# Patient Record
Sex: Female | Born: 1985 | Race: White | Hispanic: No | Marital: Single | Smoking: Current some day smoker
Health system: Southern US, Community
[De-identification: ages and names within clinical notes are randomized; demographics above are authoritative.]

## PROBLEM LIST (undated history)

## (undated) DIAGNOSIS — F419 Anxiety disorder, unspecified: Secondary | ICD-10-CM

## (undated) DIAGNOSIS — C801 Malignant (primary) neoplasm, unspecified: Secondary | ICD-10-CM

## (undated) DIAGNOSIS — G709 Myoneural disorder, unspecified: Secondary | ICD-10-CM

## (undated) HISTORY — DX: Malignant (primary) neoplasm, unspecified: C80.1

---

## 2013-06-16 DIAGNOSIS — C801 Malignant (primary) neoplasm, unspecified: Secondary | ICD-10-CM

## 2013-06-16 HISTORY — DX: Malignant (primary) neoplasm, unspecified: C80.1

## 2013-06-16 HISTORY — PX: OTHER SURGICAL HISTORY: SHX169

## 2016-07-05 DIAGNOSIS — R52 Pain, unspecified: Secondary | ICD-10-CM | POA: Diagnosis not present

## 2016-07-05 DIAGNOSIS — S79911A Unspecified injury of right hip, initial encounter: Secondary | ICD-10-CM | POA: Diagnosis not present

## 2016-07-05 DIAGNOSIS — M25551 Pain in right hip: Secondary | ICD-10-CM | POA: Diagnosis not present

## 2016-07-05 DIAGNOSIS — F17210 Nicotine dependence, cigarettes, uncomplicated: Secondary | ICD-10-CM | POA: Diagnosis not present

## 2016-07-05 DIAGNOSIS — S93401A Sprain of unspecified ligament of right ankle, initial encounter: Secondary | ICD-10-CM | POA: Diagnosis not present

## 2016-07-05 DIAGNOSIS — S86911A Strain of unspecified muscle(s) and tendon(s) at lower leg level, right leg, initial encounter: Secondary | ICD-10-CM | POA: Diagnosis not present

## 2016-07-05 DIAGNOSIS — S8991XA Unspecified injury of right lower leg, initial encounter: Secondary | ICD-10-CM | POA: Diagnosis not present

## 2016-07-05 DIAGNOSIS — M25571 Pain in right ankle and joints of right foot: Secondary | ICD-10-CM | POA: Diagnosis not present

## 2016-07-05 DIAGNOSIS — E079 Disorder of thyroid, unspecified: Secondary | ICD-10-CM | POA: Diagnosis not present

## 2016-07-05 DIAGNOSIS — S76011A Strain of muscle, fascia and tendon of right hip, initial encounter: Secondary | ICD-10-CM | POA: Diagnosis not present

## 2016-07-05 DIAGNOSIS — S96911A Strain of unspecified muscle and tendon at ankle and foot level, right foot, initial encounter: Secondary | ICD-10-CM | POA: Diagnosis not present

## 2016-07-05 DIAGNOSIS — M25561 Pain in right knee: Secondary | ICD-10-CM | POA: Diagnosis not present

## 2016-07-05 DIAGNOSIS — S99911A Unspecified injury of right ankle, initial encounter: Secondary | ICD-10-CM | POA: Diagnosis not present

## 2016-07-28 DIAGNOSIS — F603 Borderline personality disorder: Secondary | ICD-10-CM | POA: Diagnosis not present

## 2016-07-31 DIAGNOSIS — F603 Borderline personality disorder: Secondary | ICD-10-CM | POA: Diagnosis not present

## 2016-08-04 DIAGNOSIS — F900 Attention-deficit hyperactivity disorder, predominantly inattentive type: Secondary | ICD-10-CM | POA: Diagnosis not present

## 2016-08-19 DIAGNOSIS — F603 Borderline personality disorder: Secondary | ICD-10-CM | POA: Diagnosis not present

## 2016-10-10 DIAGNOSIS — M542 Cervicalgia: Secondary | ICD-10-CM | POA: Diagnosis not present

## 2016-10-10 DIAGNOSIS — M62830 Muscle spasm of back: Secondary | ICD-10-CM | POA: Diagnosis not present

## 2016-10-10 DIAGNOSIS — M545 Low back pain: Secondary | ICD-10-CM | POA: Diagnosis not present

## 2016-11-18 DIAGNOSIS — E039 Hypothyroidism, unspecified: Secondary | ICD-10-CM | POA: Diagnosis not present

## 2016-11-18 DIAGNOSIS — R5383 Other fatigue: Secondary | ICD-10-CM | POA: Diagnosis not present

## 2016-11-18 DIAGNOSIS — Z139 Encounter for screening, unspecified: Secondary | ICD-10-CM | POA: Diagnosis not present

## 2016-11-18 DIAGNOSIS — F900 Attention-deficit hyperactivity disorder, predominantly inattentive type: Secondary | ICD-10-CM | POA: Diagnosis not present

## 2016-11-25 DIAGNOSIS — F900 Attention-deficit hyperactivity disorder, predominantly inattentive type: Secondary | ICD-10-CM | POA: Diagnosis not present

## 2016-11-25 DIAGNOSIS — Z139 Encounter for screening, unspecified: Secondary | ICD-10-CM | POA: Diagnosis not present

## 2016-11-25 DIAGNOSIS — R5383 Other fatigue: Secondary | ICD-10-CM | POA: Diagnosis not present

## 2017-02-18 DIAGNOSIS — F900 Attention-deficit hyperactivity disorder, predominantly inattentive type: Secondary | ICD-10-CM | POA: Diagnosis not present

## 2017-05-20 DIAGNOSIS — F900 Attention-deficit hyperactivity disorder, predominantly inattentive type: Secondary | ICD-10-CM | POA: Diagnosis not present

## 2017-07-14 ENCOUNTER — Encounter: Payer: Self-pay | Admitting: Physician Assistant

## 2017-07-14 ENCOUNTER — Ambulatory Visit (INDEPENDENT_AMBULATORY_CARE_PROVIDER_SITE_OTHER): Payer: Federal, State, Local not specified - PPO | Admitting: Physician Assistant

## 2017-07-14 VITALS — BP 125/88 | HR 93 | Ht 62.0 in | Wt 140.0 lb

## 2017-07-14 DIAGNOSIS — Z13220 Encounter for screening for lipoid disorders: Secondary | ICD-10-CM

## 2017-07-14 DIAGNOSIS — R5383 Other fatigue: Secondary | ICD-10-CM | POA: Diagnosis not present

## 2017-07-14 DIAGNOSIS — C73 Malignant neoplasm of thyroid gland: Secondary | ICD-10-CM | POA: Diagnosis not present

## 2017-07-14 DIAGNOSIS — F17200 Nicotine dependence, unspecified, uncomplicated: Secondary | ICD-10-CM | POA: Diagnosis not present

## 2017-07-14 DIAGNOSIS — K5900 Constipation, unspecified: Secondary | ICD-10-CM | POA: Insufficient documentation

## 2017-07-14 DIAGNOSIS — L0591 Pilonidal cyst without abscess: Secondary | ICD-10-CM | POA: Insufficient documentation

## 2017-07-14 DIAGNOSIS — F902 Attention-deficit hyperactivity disorder, combined type: Secondary | ICD-10-CM | POA: Insufficient documentation

## 2017-07-14 DIAGNOSIS — Z131 Encounter for screening for diabetes mellitus: Secondary | ICD-10-CM

## 2017-07-14 MED ORDER — DOXYCYCLINE HYCLATE 100 MG PO TABS: 100.0000 mg | ORAL_TABLET | Freq: Two times a day (BID) | ORAL | 0 refills | Status: DC

## 2017-07-14 MED ORDER — IBUPROFEN 800 MG PO TABS: 800.0000 mg | ORAL_TABLET | Freq: Three times a day (TID) | ORAL | 2 refills | Status: DC | PRN

## 2017-07-14 MED ORDER — HYDROCODONE-ACETAMINOPHEN 5-325 MG PO TABS: 1.0000 | ORAL_TABLET | ORAL | 0 refills | Status: DC | PRN

## 2017-07-14 MED ORDER — AMPHETAMINE-DEXTROAMPHETAMINE 15 MG PO TABS: 15.0000 mg | ORAL_TABLET | Freq: Three times a day (TID) | ORAL | 0 refills | Status: DC

## 2017-07-14 NOTE — Progress Notes (Addendum)
Subjective:    Patient ID: Patricia Potter, female    DOB: [birth year], 32 y.o.   MRN: [id number]  HPI  Pt is a 32 year old female presenting to the clinic to establish care, manage her ADHD, and evaluate a bump on her coccyx.    She moved to the area from [facility] with her son and is currently going through a separation which is creating extra stress. She exercises on a regular basis and is a tobacco user. She reports that has not sleep well recently but attributes that to current stress.She has tried melatonin with no benefit. She has a history of ADHD and takes adderall IR 15mg  up to 3 times daily. She is content with the current dose and feels well controlled. She has felt fatigue, cold intolerance, and constipation that she is concerned may be related to her thyroid. 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 She denies feeling any lumps on her thyroid or trouble swallowing. She is also have pain on he tailbone that started recently.   She is up to date on preventative medicine except for her tetanus/TDAP. She has had a flu vaccine in late 2018, PAP Smear in 2018 at [facility] that reported no abnormalities.   Coccyx Pain:   She noticed a tender bump on her tailbone Saturday which is worse with sitting and laying on her left side. She feels like she bruised on the top of her lower back as well. She denies any recent injury. She has not tried NSAIDs or warm compresses. She has taken a hot bath to try to alleviate her symptoms but it has not changed. She rates the pain as 5/10.   .. Active Ambulatory Problems    Diagnosis Date Noted  . Papillary thyroid carcinoma ([place]) 07/14/2017  . Pilonidal cyst 07/14/2017  . Attention deficit hyperactivity disorder (ADHD), combined type 07/14/2017  . No energy 07/14/2017  . Constipation 07/14/2017  . Current smoker 07/14/2017   Resolved Ambulatory Problems    Diagnosis Date  Noted  . No Resolved Ambulatory Problems   No Additional Past Medical History    Review of Systems  Constitutional: Positive for fatigue. Negative for chills, diaphoresis, fever and unexpected weight change.  Respiratory: Negative for shortness of breath.   Cardiovascular: Negative for chest pain and palpitations.  Gastrointestinal: Positive for constipation. Negative for diarrhea, nausea and vomiting.  Endocrine: Positive for cold intolerance.  Genitourinary: Negative for difficulty urinating.  Musculoskeletal: Positive for back pain.       Objective:   Physical Exam  Constitutional: She is oriented to person, place, and time. She appears well-developed and well-nourished. No distress.  HENT:  Head: Normocephalic and atraumatic.  Neck: No thyromegaly present.  Cardiovascular: Normal rate, regular rhythm and normal heart sounds. Exam reveals no gallop and no friction rub.  No murmur heard. Pulmonary/Chest: Effort normal and breath sounds normal.  Neurological: She is alert and oriented to person, place, and time.  Skin:     Palpable solid nodule consistent with a pilonidal cyst in the location noted in the image. No erythema or fluctuance.   Psychiatric: She has a normal mood and affect. Her behavior is normal.    Vitals:   07/14/17 0930  BP: 125/88  Pulse: 93    Depression screen PHQ 2/9 07/14/2017  Decreased Interest 0  Down, Depressed, Hopeless 1  PHQ - 2 Score 1  Assessment & Plan:   Patricia Potter was seen today for establish care.  Diagnoses and all orders for this visit:  Pilonidal cyst -     doxycycline (VIBRA-TABS) 100 MG tablet; Take 1 tablet (100 mg total) by mouth 2 (two) times daily. For 10 days. -     ibuprofen (ADVIL,MOTRIN) 800 MG tablet; Take 1 tablet (800 mg total) by mouth every 8 (eight) hours as needed. -     HYDROcodone-acetaminophen (NORCO/VICODIN) 5-325 MG tablet; Take 1 tablet by mouth every 4 (four) hours as needed for moderate  pain.  Papillary thyroid carcinoma (HCC) -     US THYROID -     TSH -     T4, free -     T3, free  Current smoker  No energy -     US THYROID -     TSH -     T4, free -     T3, free -     CBC -     C-reactive protein -     Ferritin -     Sedimentation rate -     Vitamin B12 -     Vit D  25 hydroxy (rtn osteoporosis monitoring)  Constipation, unspecified constipation type -     US THYROID -     TSH -     T4, free -     T3, free  Attention deficit hyperactivity disorder (ADHD), combined type -     amphetamine-dextroamphetamine (ADDERALL) 15 MG tablet; Take 1 tablet by mouth 3 (three) times daily.  Screening for diabetes mellitus -     COMPLETE METABOLIC PANEL WITH GFR  Screening for lipid disorders -     Lipid Panel w/reflex Direct LDL   Patricia Potter was given an educational handout on pilonidal cysts. She has been instructed to the the [place] for pain as needed for severe pain, ibuprofen for daily pain, doxycycline for 10 days, and frequent warm compresses at the site of the pilonidal cyst. She should keep the area dry when not applying warm compresses. If the cyst gets more painful, infected, red, or is causing persistent problems then she will be referred to general surgery for removal.   We will order a thyroid US for monitoring of her remaining thyroid for chance of residual malignancy and have ordered thyroid labs to evaluate for hypothyroidism.   I will take over her ADHD management and have refilled her adderall prescription. She will be called with the results of her baseline labs. If sleep continues to be a problem, she can try valerian root or Unisom as sleep aids over the counter.

## 2017-07-14 NOTE — Patient Instructions (Addendum)
Get labs.  Refilled adderall. Follow up in 3 months.    Pilonidal Cyst A pilonidal cyst is a fluid-filled sac. It forms beneath the skin near your tailbone, at the top of the crease of your buttocks. A pilonidal cyst that is not large or infected may not cause symptoms or problems. If the cyst becomes irritated or infected, it may fill with pus. This causes pain and swelling (pilonidal abscess). An infected cyst may need to be treated with medicine, drained, or removed. What are the causes? The cause of a pilonidal cyst is not known. One cause may be a hair that grows into your skin (ingrown hair). What increases the risk? Pilonidal cysts are more common in boys and men. Risk factors include:  Having lots of hair near the crease of the buttocks.  Being overweight.  Having a pilonidal dimple.  Wearing tight clothing.  Not bathing or showering frequently.  Sitting for long periods of time.  What are the signs or symptoms? Signs and symptoms of a pilonidal cyst may include:  Redness.  Pain and tenderness.  Warmth.  Swelling.  Pus.  Fever.  How is this diagnosed? Your health care provider may diagnose a pilonidal cyst based on your symptoms and a physical exam. The health care provider may do a blood test to check for infection. If your cyst is draining pus, your health care provider may take a sample of the drainage to be tested at a laboratory. How is this treated? Surgery is the usual treatment for an infected pilonidal cyst. You may also have to take medicines before surgery. The type of surgery you have depends on the size and severity of the infected cyst. The different kinds of surgery include:  Incision and drainage. This is a procedure to open and drain the cyst.  Marsupialization. In this procedure, a large cyst or abscess may be opened and kept open by stitching the edges of the skin to the cyst walls.  Cyst removal. This procedure involves opening the skin and  removing all or part of the cyst.  Follow these instructions at home:  Follow all of your surgeon's instructions carefully if you had surgery.  Take medicines only as directed by your health care provider.  If you were prescribed an antibiotic medicine, finish it all even if you start to feel better.  Keep the area around your pilonidal cyst clean and dry.  Clean the area as directed by your health care provider. Pat the area dry with a clean towel. Do not rub it as this may cause bleeding.  Remove hair from the area around the cyst as directed by your health care provider.  Do not wear tight clothing or sit in one place for long periods of time.  There are many different ways to close and cover an incision, including stitches, skin glue, and adhesive strips. Follow your health care provider's instructions on: ? Incision care. ? Bandage (dressing) changes and removal. ? Incision closure removal. Contact a health care provider if:  You have drainage, redness, swelling, or pain at the site of the cyst.  You have a fever. This information is not intended to replace advice given to you by your health care provider. Make sure you discuss any questions you have with your health care provider. Document Released: 05/30/2000 Document Revised: 11/08/2015 Document Reviewed: 10/20/2013 Elsevier Interactive Patient Education  2018 [employer].

## 2017-07-15 ENCOUNTER — Encounter: Payer: Self-pay | Admitting: Physician Assistant

## 2017-07-15 DIAGNOSIS — R790 Abnormal level of blood mineral: Secondary | ICD-10-CM | POA: Insufficient documentation

## 2017-07-15 DIAGNOSIS — E559 Vitamin D deficiency, unspecified: Secondary | ICD-10-CM | POA: Insufficient documentation

## 2017-07-15 DIAGNOSIS — E538 Deficiency of other specified B group vitamins: Secondary | ICD-10-CM | POA: Insufficient documentation

## 2017-07-15 LAB — COMPLETE METABOLIC PANEL WITH GFR
AG RATIO: 1.7 (calc) (ref 1.0–2.5)
ALBUMIN MSPROF: 4.6 g/dL (ref 3.6–5.1)
ALT: 7 U/L (ref 6–29)
AST: 14 U/L (ref 10–30)
Alkaline phosphatase (APISO): 54 U/L (ref 33–115)
BILIRUBIN TOTAL: 0.3 mg/dL (ref 0.2–1.2)
BUN: 17 mg/dL (ref 7–25)
CHLORIDE: 108 mmol/L (ref 98–110)
CO2: 24 mmol/L (ref 20–32)
Calcium: 9.6 mg/dL (ref 8.6–10.2)
Creat: 0.69 mg/dL (ref 0.50–1.10)
GFR, EST AFRICAN AMERICAN: 135 mL/min/{1.73_m2} (ref >=60)
GFR, Est Non African American: 117 mL/min/{1.73_m2} (ref >=60)
GLOBULIN: 2.7 g/dL (ref 1.9–3.7)
GLUCOSE: 95 mg/dL (ref 65–99)
POTASSIUM: 4.6 mmol/L (ref 3.5–5.3)
SODIUM: 140 mmol/L (ref 135–146)
TOTAL PROTEIN: 7.3 g/dL (ref 6.1–8.1)

## 2017-07-15 LAB — SEDIMENTATION RATE: Sed Rate: 17 mm/h (ref 0–20)

## 2017-07-15 LAB — LIPID PANEL W/REFLEX DIRECT LDL
CHOL/HDL RATIO: 4 (calc) (ref <5.0)
CHOLESTEROL: 182 mg/dL (ref <200)
HDL: 46 mg/dL — AB (ref >50)
LDL CHOLESTEROL (CALC): 115 mg/dL — AB
Non-HDL Cholesterol (Calc): 136 mg/dL (calc) — ABNORMAL HIGH (ref <130)
Triglycerides: 104 mg/dL (ref <150)

## 2017-07-15 LAB — C-REACTIVE PROTEIN: CRP: 2.3 mg/L (ref <8.0)

## 2017-07-15 LAB — CBC
HCT: 40.6 % (ref 35.0–45.0)
Hemoglobin: 13.7 g/dL (ref 11.7–15.5)
MCH: 28.2 pg (ref 27.0–33.0)
MCHC: 33.7 g/dL (ref 32.0–36.0)
MCV: 83.5 fL (ref 80.0–100.0)
MPV: 11.4 fL (ref 7.5–12.5)
Platelets: 297 10*3/uL (ref 140–400)
RBC: 4.86 10*6/uL (ref 3.80–5.10)
RDW: 14.2 % (ref 11.0–15.0)
WBC: 10.1 10*3/uL (ref 3.8–10.8)

## 2017-07-15 LAB — FERRITIN: FERRITIN: 4 ng/mL — AB (ref 10–154)

## 2017-07-15 LAB — T3, FREE: T3, Free: 3.1 pg/mL (ref 2.3–4.2)

## 2017-07-15 LAB — T4, FREE: Free T4: 1.4 ng/dL (ref 0.8–1.8)

## 2017-07-15 LAB — TSH: TSH: 1.57 m[IU]/L

## 2017-07-15 LAB — VITAMIN B12: VITAMIN B 12: 237 pg/mL (ref 200–1100)

## 2017-07-15 LAB — VITAMIN D 25 HYDROXY (VIT D DEFICIENCY, FRACTURES): VIT D 25 HYDROXY: 23 ng/mL — AB (ref 30–100)

## 2017-07-16 ENCOUNTER — Ambulatory Visit (INDEPENDENT_AMBULATORY_CARE_PROVIDER_SITE_OTHER): Payer: Federal, State, Local not specified - PPO

## 2017-07-16 DIAGNOSIS — C73 Malignant neoplasm of thyroid gland: Secondary | ICD-10-CM | POA: Diagnosis not present

## 2017-07-16 DIAGNOSIS — Z85850 Personal history of malignant neoplasm of thyroid: Secondary | ICD-10-CM

## 2017-07-16 DIAGNOSIS — E041 Nontoxic single thyroid nodule: Secondary | ICD-10-CM | POA: Diagnosis not present

## 2017-07-30 ENCOUNTER — Encounter: Payer: Self-pay | Admitting: Physician Assistant

## 2017-07-30 ENCOUNTER — Other Ambulatory Visit: Payer: Self-pay | Admitting: Physician Assistant

## 2017-07-30 DIAGNOSIS — F902 Attention-deficit hyperactivity disorder, combined type: Secondary | ICD-10-CM

## 2017-09-08 ENCOUNTER — Encounter: Payer: Self-pay | Admitting: Physician Assistant

## 2017-09-08 ENCOUNTER — Ambulatory Visit (INDEPENDENT_AMBULATORY_CARE_PROVIDER_SITE_OTHER): Payer: Federal, State, Local not specified - PPO | Admitting: Physician Assistant

## 2017-09-08 VITALS — BP 111/74 | HR 86 | Temp 98.2°F | Ht 62.0 in | Wt 139.0 lb

## 2017-09-08 DIAGNOSIS — F419 Anxiety disorder, unspecified: Secondary | ICD-10-CM

## 2017-09-08 DIAGNOSIS — R05 Cough: Secondary | ICD-10-CM | POA: Diagnosis not present

## 2017-09-08 DIAGNOSIS — R058 Other specified cough: Secondary | ICD-10-CM

## 2017-09-08 DIAGNOSIS — F339 Major depressive disorder, recurrent, unspecified: Secondary | ICD-10-CM | POA: Diagnosis not present

## 2017-09-08 DIAGNOSIS — F430 Acute stress reaction: Secondary | ICD-10-CM | POA: Diagnosis not present

## 2017-09-08 DIAGNOSIS — M549 Dorsalgia, unspecified: Secondary | ICD-10-CM

## 2017-09-08 MED ORDER — CYCLOBENZAPRINE HCL 10 MG PO TABS: 10.0000 mg | ORAL_TABLET | Freq: Three times a day (TID) | ORAL | 0 refills | Status: DC | PRN

## 2017-09-08 MED ORDER — HYDROXYZINE HCL 10 MG PO TABS: 10.0000 mg | ORAL_TABLET | Freq: Three times a day (TID) | ORAL | 0 refills | Status: DC | PRN

## 2017-09-08 MED ORDER — BENZONATATE 200 MG PO CAPS: 200.0000 mg | ORAL_CAPSULE | Freq: Two times a day (BID) | ORAL | 0 refills | Status: DC | PRN

## 2017-09-08 MED ORDER — ESCITALOPRAM OXALATE 10 MG PO TABS: 10.0000 mg | ORAL_TABLET | Freq: Every day | ORAL | 1 refills | Status: DC

## 2017-09-08 NOTE — Progress Notes (Deleted)
   Subjective:    Patient ID: Patricia Potter, female    DOB: 03-20-1986, 32 y.o.   MRN: [id number]  HPI    Review of Systems  All other systems reviewed and are negative.      Objective:   Physical Exam  Constitutional: She appears well-developed and well-nourished.  HENT:  Head: Normocephalic and atraumatic.  Neck: Normal range of motion. Neck supple.  Cardiovascular: Normal rate, regular rhythm and normal heart sounds.  Pulmonary/Chest: Effort normal and breath sounds normal. She has no wheezes.  Lymphadenopathy:    She has no cervical adenopathy.          Assessment & Plan:  Marland KitchenMarland KitchenDiagnoses and all orders for this visit:  Post-viral cough syndrome -     benzonatate (TESSALON) 200 MG capsule; Take 1 capsule (200 mg total) by mouth 2 (two) times daily as needed for cough.  Depression, recurrent ([place]) -     escitalopram (LEXAPRO) 10 MG tablet; Take 1 tablet (10 mg total) by mouth daily.  Anxiety -     escitalopram (LEXAPRO) 10 MG tablet; Take 1 tablet (10 mg total) by mouth daily. -     hydrOXYzine (ATARAX/VISTARIL) 10 MG tablet; Take 1 tablet (10 mg total) by mouth 3 (three) times daily as needed.  Stress reaction -     escitalopram (LEXAPRO) 10 MG tablet; Take 1 tablet (10 mg total) by mouth daily.  Mid-back pain, acute -     cyclobenzaprine (FLEXERIL) 10 MG tablet; Take 1 tablet (10 mg total) by mouth 3 (three) times daily as needed for muscle spasms.

## 2017-09-08 NOTE — Patient Instructions (Signed)

## 2017-09-08 NOTE — Progress Notes (Signed)
Subjective:     Patient ID: Patricia Potter, female   DOB: [birth year], 32 y.o.   MRN: [id number]  HPI Patricia Potter is a 32 year old female who presented with a complaint regarding a cough and some congestion for the past week or so. She states the cough feels deep and the sputum is clear. She denies any fevers, chills, nausea, vomiting, body aches, sore throat. She has attempted OTC pseudofed for her symptoms.   She also complains about some depression. She is going through a divorce with her husband and states that her husband is wanting very little to do with her and the children. She is having a tough time dealing with this and helping her kids through the situation. She states she was on something a long time ago for depression in high school but she does not remember what. She denies any SI/HI.  She also mentions some upper middle back pain. She is an avid weight lifter and runner. She has not taken anyting for this pain.  .. Active Ambulatory Problems    Diagnosis Date Noted  . Papillary thyroid carcinoma ([place]) 07/14/2017  . Pilonidal cyst 07/14/2017  . Attention deficit hyperactivity disorder (ADHD), combined type 07/14/2017  . No energy 07/14/2017  . Constipation 07/14/2017  . Current smoker 07/14/2017  . Low serum vitamin B12 07/15/2017  . Vitamin D insufficiency 07/15/2017  . Low iron stores 07/15/2017   Resolved Ambulatory Problems    Diagnosis Date Noted  . No Resolved Ambulatory Problems   Past Medical History:  Diagnosis Date  . Papillary carcinoma ([place]) 2015      Review of Systems  Constitutional: Negative for appetite change, chills and fever.  HENT: Positive for congestion. Negative for ear pain, sinus pressure, sinus pain and sore throat.   Respiratory: Positive for cough. Negative for shortness of breath.   Cardiovascular: Negative for chest pain and palpitations.  Gastrointestinal: Negative for nausea and vomiting.  Musculoskeletal: Positive for back pain.   Psychiatric/Behavioral: Positive for behavioral problems ( depression). Negative for suicidal ideas.       Objective:   Physical Exam  Constitutional: She is oriented to person, place, and time. She appears well-developed and well-nourished. No distress.  HENT:  Head: Normocephalic and atraumatic.  Right Ear: Tympanic membrane and external ear normal.  Left Ear: Tympanic membrane and external ear normal.  Nose: Nose normal.  Mouth/Throat: Oropharynx is clear and moist and mucous membranes are normal. No oropharyngeal exudate, posterior oropharyngeal edema or posterior oropharyngeal erythema.  TM's clear bilaterally.  No sinus tenderness to palpation.   Eyes: Conjunctivae are normal. Right eye exhibits no discharge. Left eye exhibits no discharge.  Cardiovascular: Normal rate, regular rhythm and normal heart sounds.  Pulmonary/Chest: Breath sounds normal. She is in respiratory distress.  Musculoskeletal: Normal range of motion.  Diffuse thoracic paravertebral muscle spasms  Lymphadenopathy:    She has no cervical adenopathy.  Neurological: She is alert and oriented to person, place, and time.  Skin: Skin is warm and dry.  Psychiatric: She has a normal mood and affect. Her behavior is normal. Thought content normal.       Assessment:     Marland KitchenMarland KitchenDiagnoses and all orders for this visit:  Post-viral cough syndrome -     benzonatate (TESSALON) 200 MG capsule; Take 1 capsule (200 mg total) by mouth 2 (two) times daily as needed for cough.  Depression, recurrent ([place]) -     escitalopram (LEXAPRO) 10 MG tablet; Take 1 tablet (10 mg  total) by mouth daily.  Anxiety -     escitalopram (LEXAPRO) 10 MG tablet; Take 1 tablet (10 mg total) by mouth daily. -     hydrOXYzine (ATARAX/VISTARIL) 10 MG tablet; Take 1 tablet (10 mg total) by mouth 3 (three) times daily as needed.  Stress reaction -     escitalopram (LEXAPRO) 10 MG tablet; Take 1 tablet (10 mg total) by mouth daily.  Mid-back pain,  acute -     cyclobenzaprine (FLEXERIL) 10 MG tablet; Take 1 tablet (10 mg total) by mouth 3 (three) times daily as needed for muscle spasms.   .. Depression screen [facility] 2/9 09/08/2017 07/14/2017  Decreased Interest 2 0  Down, Depressed, Hopeless 1 1  PHQ - 2 Score 3 1  Altered sleeping 2 -  Tired, decreased energy 1 -  Change in appetite 1 -  Feeling bad or failure about yourself  1 -  Trouble concentrating 0 -  Moving slowly or fidgety/restless 0 -  Suicidal thoughts 0 -  PHQ-9 Score 8 -  Difficult doing work/chores Very difficult -   .. GAD 7 : Generalized Anxiety Score 09/08/2017  Nervous, Anxious, on Edge 2  Control/stop worrying 2  Worry too much - different things 1  Trouble relaxing 1  Restless 0  Easily annoyed or irritable 2  Afraid - awful might happen 1  Total GAD 7 Score 9  Anxiety Difficulty Very difficult        Plan:        discussed cough likely post viral. Given tessalon pearls as needed. Symptomatic treatment discussed. Follow up as needed.   Will start lexapro for mood. Discussed side effects. Follow up in 4-6 weeks.  Vistaril as needed for acute anxiety.  Encouraged family counseling to help as going through divorce.   Discussed muscle spasm of mid back. HO given for upper back exercises. Consider heat, massage, biofreeze, NSAIDs. Follow up as needed.

## 2017-09-17 ENCOUNTER — Encounter: Payer: Self-pay | Admitting: Physician Assistant

## 2017-09-17 ENCOUNTER — Other Ambulatory Visit: Payer: Self-pay | Admitting: Physician Assistant

## 2017-09-17 DIAGNOSIS — F902 Attention-deficit hyperactivity disorder, combined type: Secondary | ICD-10-CM

## 2017-09-17 MED ORDER — AMPHETAMINE-DEXTROAMPHETAMINE 15 MG PO TABS: 15.0000 mg | ORAL_TABLET | Freq: Three times a day (TID) | ORAL | 0 refills | Status: DC

## 2017-10-12 ENCOUNTER — Ambulatory Visit: Payer: Federal, State, Local not specified - PPO | Admitting: Physician Assistant

## 2017-10-12 DIAGNOSIS — Z0189 Encounter for other specified special examinations: Secondary | ICD-10-CM

## 2017-11-03 ENCOUNTER — Other Ambulatory Visit: Payer: Self-pay | Admitting: Physician Assistant

## 2017-11-03 ENCOUNTER — Encounter: Payer: Self-pay | Admitting: Physician Assistant

## 2017-11-03 DIAGNOSIS — F430 Acute stress reaction: Secondary | ICD-10-CM

## 2017-11-03 DIAGNOSIS — F419 Anxiety disorder, unspecified: Secondary | ICD-10-CM

## 2017-11-03 DIAGNOSIS — F902 Attention-deficit hyperactivity disorder, combined type: Secondary | ICD-10-CM

## 2017-11-03 DIAGNOSIS — F339 Major depressive disorder, recurrent, unspecified: Secondary | ICD-10-CM

## 2017-11-03 MED ORDER — ESCITALOPRAM OXALATE 10 MG PO TABS: 10.0000 mg | ORAL_TABLET | Freq: Every day | ORAL | 0 refills | Status: DC

## 2017-11-03 MED ORDER — AMPHETAMINE-DEXTROAMPHETAMINE 15 MG PO TABS: 15.0000 mg | ORAL_TABLET | Freq: Three times a day (TID) | ORAL | 0 refills | Status: DC

## 2017-11-03 MED ORDER — HYDROXYZINE HCL 10 MG PO TABS: 10.0000 mg | ORAL_TABLET | Freq: Three times a day (TID) | ORAL | 0 refills | Status: DC | PRN

## 2017-12-15 ENCOUNTER — Other Ambulatory Visit: Payer: Self-pay | Admitting: Physician Assistant

## 2017-12-15 DIAGNOSIS — F902 Attention-deficit hyperactivity disorder, combined type: Secondary | ICD-10-CM

## 2017-12-16 ENCOUNTER — Encounter: Payer: Self-pay | Admitting: Physician Assistant

## 2017-12-16 MED ORDER — AMPHETAMINE-DEXTROAMPHETAMINE 15 MG PO TABS: 15.0000 mg | ORAL_TABLET | Freq: Three times a day (TID) | ORAL | 0 refills | Status: DC

## 2017-12-16 NOTE — Telephone Encounter (Signed)
My electronic controlled substance device is not working. Pt will have to pick up.

## 2017-12-16 NOTE — Telephone Encounter (Signed)
Printed and reday for pick up.

## 2017-12-30 ENCOUNTER — Other Ambulatory Visit: Payer: Self-pay | Admitting: Physician Assistant

## 2017-12-30 DIAGNOSIS — F419 Anxiety disorder, unspecified: Secondary | ICD-10-CM

## 2017-12-30 DIAGNOSIS — F430 Acute stress reaction: Secondary | ICD-10-CM

## 2017-12-30 DIAGNOSIS — F339 Major depressive disorder, recurrent, unspecified: Secondary | ICD-10-CM

## 2017-12-31 ENCOUNTER — Other Ambulatory Visit: Payer: Self-pay

## 2017-12-31 DIAGNOSIS — F339 Major depressive disorder, recurrent, unspecified: Secondary | ICD-10-CM

## 2017-12-31 DIAGNOSIS — F419 Anxiety disorder, unspecified: Secondary | ICD-10-CM

## 2017-12-31 DIAGNOSIS — F430 Acute stress reaction: Secondary | ICD-10-CM

## 2017-12-31 MED ORDER — ESCITALOPRAM OXALATE 10 MG PO TABS
10.0000 mg | ORAL_TABLET | Freq: Every day | ORAL | 0 refills | Status: DC
Start: 2017-12-31 — End: 2018-04-15

## 2018-01-11 ENCOUNTER — Ambulatory Visit: Payer: Federal, State, Local not specified - PPO | Admitting: Physician Assistant

## 2018-02-04 ENCOUNTER — Other Ambulatory Visit: Payer: Self-pay | Admitting: Physician Assistant

## 2018-02-04 DIAGNOSIS — F902 Attention-deficit hyperactivity disorder, combined type: Secondary | ICD-10-CM

## 2018-02-05 NOTE — Telephone Encounter (Signed)
Tried to call patient and the home number just gives a busy signal and the mobile number says its been changed or disconnected. KG LPN

## 2018-02-05 NOTE — Telephone Encounter (Signed)
She needs to schedule an appointment.  She was last seen in March and was actually supposed to follow-up in 4 to 6 weeks for a new start of Lexapro which we have been refilling.  Plus since she is on Adderall she really needs to be followed up every 4 months at a minimum.  Please have her schedule appointment I did not see that she Artie has one on the books.

## 2018-02-09 ENCOUNTER — Encounter: Payer: Self-pay | Admitting: Physician Assistant

## 2018-03-26 ENCOUNTER — Ambulatory Visit: Payer: Federal, State, Local not specified - PPO | Admitting: Physician Assistant

## 2018-03-31 ENCOUNTER — Telehealth: Payer: Self-pay | Admitting: Physician Assistant

## 2018-03-31 DIAGNOSIS — F902 Attention-deficit hyperactivity disorder, combined type: Secondary | ICD-10-CM

## 2018-03-31 NOTE — Telephone Encounter (Signed)
I called Katara to schedule, no answer. I left her a voicemail to call back

## 2018-03-31 NOTE — Telephone Encounter (Signed)
Patricia Potter called in apologizing for missing her appt on Friday. She wanted to come in today to get refills, however I explained that [name] was out of the office. She asked if someone could call in refills for her Adderakl and the Lexapro. Pharmacy on file is correct.

## 2018-03-31 NOTE — Telephone Encounter (Signed)
She needs to reschedule, then I will route to covering Provider to see if they will write short term to last until appointment.

## 2018-04-05 MED ORDER — AMPHETAMINE-DEXTROAMPHETAMINE 15 MG PO TABS: 15.0000 mg | ORAL_TABLET | Freq: Three times a day (TID) | ORAL | 0 refills | Status: DC

## 2018-04-05 NOTE — Telephone Encounter (Signed)
I did send one more month. Needs follow up.

## 2018-04-12 ENCOUNTER — Ambulatory Visit: Payer: Federal, State, Local not specified - PPO | Admitting: Physician Assistant

## 2018-04-15 ENCOUNTER — Ambulatory Visit (INDEPENDENT_AMBULATORY_CARE_PROVIDER_SITE_OTHER): Payer: Federal, State, Local not specified - PPO | Admitting: Physician Assistant

## 2018-04-15 ENCOUNTER — Ambulatory Visit (INDEPENDENT_AMBULATORY_CARE_PROVIDER_SITE_OTHER): Payer: Federal, State, Local not specified - PPO | Admitting: Physical Therapy

## 2018-04-15 ENCOUNTER — Encounter: Payer: Self-pay | Admitting: Physical Therapy

## 2018-04-15 ENCOUNTER — Encounter: Payer: Self-pay | Admitting: Physician Assistant

## 2018-04-15 DIAGNOSIS — M546 Pain in thoracic spine: Secondary | ICD-10-CM

## 2018-04-15 DIAGNOSIS — R0789 Other chest pain: Secondary | ICD-10-CM | POA: Diagnosis not present

## 2018-04-15 DIAGNOSIS — F339 Major depressive disorder, recurrent, unspecified: Secondary | ICD-10-CM

## 2018-04-15 DIAGNOSIS — M5136 Other intervertebral disc degeneration, lumbar region: Secondary | ICD-10-CM | POA: Insufficient documentation

## 2018-04-15 DIAGNOSIS — F430 Acute stress reaction: Secondary | ICD-10-CM

## 2018-04-15 DIAGNOSIS — F902 Attention-deficit hyperactivity disorder, combined type: Secondary | ICD-10-CM

## 2018-04-15 DIAGNOSIS — F419 Anxiety disorder, unspecified: Secondary | ICD-10-CM

## 2018-04-15 DIAGNOSIS — M51369 Other intervertebral disc degeneration, lumbar region without mention of lumbar back pain or lower extremity pain: Secondary | ICD-10-CM | POA: Insufficient documentation

## 2018-04-15 MED ORDER — NAPROXEN 500 MG PO TABS: 500.0000 mg | ORAL_TABLET | Freq: Two times a day (BID) | ORAL | 0 refills | Status: DC | PRN

## 2018-04-15 MED ORDER — ESCITALOPRAM OXALATE 10 MG PO TABS: 10.0000 mg | ORAL_TABLET | Freq: Every day | ORAL | 0 refills | Status: DC

## 2018-04-15 MED ORDER — NONFORMULARY OR COMPOUNDED ITEM: 0 refills | Status: DC

## 2018-04-15 MED ORDER — AMPHETAMINE-DEXTROAMPHETAMINE 15 MG PO TABS: 15.0000 mg | ORAL_TABLET | Freq: Three times a day (TID) | ORAL | 0 refills | Status: DC

## 2018-04-15 MED ORDER — METHOCARBAMOL 500 MG PO TABS: 500.0000 mg | ORAL_TABLET | Freq: Three times a day (TID) | ORAL | 0 refills | Status: DC | PRN
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
## 2018-04-15 NOTE — Patient Instructions (Signed)
Access Code: [facility]  URL: [email or web address]  Date: 04/15/2018  Prepared by: Faustino Congress   Exercises  Supine Chest Stretch on Foam Roll - 1 reps - 1 sets - 3-5 min hold - 1x daily - 7x weekly  Standing 'L' Stretch at Counter - 3 reps - 1 sets - 30 sec hold - 1x daily - 7x weekly  Patient Education  Trigger Point Dry Needling
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
## 2018-04-21 ENCOUNTER — Telehealth: Payer: Self-pay | Admitting: Physical Therapy

## 2018-04-21 ENCOUNTER — Encounter: Payer: Self-pay | Admitting: Physical Therapy

## 2018-04-21 NOTE — Telephone Encounter (Signed)
Attempted to call pt due to no show for PT appt.  Unable to leave message.

## 2018-04-23 ENCOUNTER — Encounter: Payer: Federal, State, Local not specified - PPO | Admitting: Physical Therapy

## 2018-06-07 ENCOUNTER — Other Ambulatory Visit: Payer: Self-pay | Admitting: Physician Assistant

## 2018-06-07 DIAGNOSIS — F902 Attention-deficit hyperactivity disorder, combined type: Secondary | ICD-10-CM

## 2018-06-08 ENCOUNTER — Encounter: Payer: Self-pay | Admitting: Physician Assistant

## 2018-06-08 ENCOUNTER — Other Ambulatory Visit: Payer: Self-pay | Admitting: Physician Assistant

## 2018-06-08 DIAGNOSIS — F902 Attention-deficit hyperactivity disorder, combined type: Secondary | ICD-10-CM

## 2018-06-10 MED ORDER — AMPHETAMINE-DEXTROAMPHETAMINE 15 MG PO TABS: 15.0000 mg | ORAL_TABLET | Freq: Three times a day (TID) | ORAL | 0 refills | Status: DC

## 2018-06-10 NOTE — Telephone Encounter (Signed)
Looks like [facility] already sent

## 2018-06-17 ENCOUNTER — Other Ambulatory Visit: Payer: Self-pay | Admitting: Physician Assistant

## 2018-06-17 DIAGNOSIS — Z1239 Encounter for other screening for malignant neoplasm of breast: Secondary | ICD-10-CM

## 2018-06-21 ENCOUNTER — Ambulatory Visit: Payer: Federal, State, Local not specified - PPO | Admitting: Physician Assistant

## 2018-06-23 ENCOUNTER — Ambulatory Visit: Payer: Federal, State, Local not specified - PPO | Admitting: Physician Assistant

## 2018-06-23 ENCOUNTER — Ambulatory Visit: Payer: Federal, State, Local not specified - PPO

## 2018-07-29 ENCOUNTER — Other Ambulatory Visit: Payer: Self-pay | Admitting: Physician Assistant

## 2018-07-29 DIAGNOSIS — F430 Acute stress reaction: Secondary | ICD-10-CM

## 2018-07-29 DIAGNOSIS — F902 Attention-deficit hyperactivity disorder, combined type: Secondary | ICD-10-CM

## 2018-07-29 DIAGNOSIS — F339 Major depressive disorder, recurrent, unspecified: Secondary | ICD-10-CM

## 2018-07-29 DIAGNOSIS — F419 Anxiety disorder, unspecified: Secondary | ICD-10-CM

## 2018-07-30 MED ORDER — ESCITALOPRAM OXALATE 10 MG PO TABS: 10.0000 mg | ORAL_TABLET | Freq: Every day | ORAL | 0 refills | Status: DC

## 2018-07-30 MED ORDER — AMPHETAMINE-DEXTROAMPHETAMINE 15 MG PO TABS: 15.0000 mg | ORAL_TABLET | Freq: Three times a day (TID) | ORAL | 0 refills | Status: DC

## 2018-08-30 ENCOUNTER — Ambulatory Visit (INDEPENDENT_AMBULATORY_CARE_PROVIDER_SITE_OTHER): Payer: Federal, State, Local not specified - PPO | Admitting: Physician Assistant

## 2018-08-30 ENCOUNTER — Other Ambulatory Visit: Payer: Self-pay

## 2018-08-30 ENCOUNTER — Ambulatory Visit (INDEPENDENT_AMBULATORY_CARE_PROVIDER_SITE_OTHER): Payer: Federal, State, Local not specified - PPO

## 2018-08-30 ENCOUNTER — Encounter: Payer: Self-pay | Admitting: Physician Assistant

## 2018-08-30 VITALS — BP 111/68 | HR 73 | Resp 18 | Ht 62.0 in | Wt 156.0 lb

## 2018-08-30 DIAGNOSIS — F902 Attention-deficit hyperactivity disorder, combined type: Secondary | ICD-10-CM | POA: Diagnosis not present

## 2018-08-30 DIAGNOSIS — F419 Anxiety disorder, unspecified: Secondary | ICD-10-CM | POA: Diagnosis not present

## 2018-08-30 DIAGNOSIS — M5412 Radiculopathy, cervical region: Secondary | ICD-10-CM

## 2018-08-30 DIAGNOSIS — F339 Major depressive disorder, recurrent, unspecified: Secondary | ICD-10-CM

## 2018-08-30 DIAGNOSIS — F430 Acute stress reaction: Secondary | ICD-10-CM | POA: Diagnosis not present

## 2018-08-30 DIAGNOSIS — M50321 Other cervical disc degeneration at C4-C5 level: Secondary | ICD-10-CM

## 2018-08-30 DIAGNOSIS — M542 Cervicalgia: Secondary | ICD-10-CM

## 2018-08-30 DIAGNOSIS — M546 Pain in thoracic spine: Secondary | ICD-10-CM

## 2018-08-30 MED ORDER — NAPROXEN 500 MG PO TABS: 500.0000 mg | ORAL_TABLET | Freq: Two times a day (BID) | ORAL | 2 refills | Status: DC | PRN

## 2018-08-30 MED ORDER — PREDNISONE 50 MG PO TABS: ORAL_TABLET | ORAL | 0 refills | Status: DC

## 2018-08-30 MED ORDER — AMPHETAMINE-DEXTROAMPHETAMINE 15 MG PO TABS: 15.0000 mg | ORAL_TABLET | Freq: Two times a day (BID) | ORAL | 0 refills | Status: DC

## 2018-08-30 MED ORDER — ESCITALOPRAM OXALATE 10 MG PO TABS: 10.0000 mg | ORAL_TABLET | Freq: Every day | ORAL | 4 refills | Status: DC

## 2018-08-30 NOTE — Progress Notes (Signed)
Subjective:    Patient ID: Patricia Potter, female    DOB: 13-Aug-1985, 32 y.o.   MRN: [id number]  HPI  Pt is a 33 yo female with ADD, depression, anxiety who presents to the clinic for medication refills.   Doing well on adderall. No problems or concerns. No increase in anxiety or depression.   Mood is overall good. No SI/HC. Medication working well.   She continues to have neck pain and numbness and tingling down arm into hands. Denies any strength changes. No new trauma or injury. Hx of this off and on. Previously controlled with chiropractic and massage therapy care. Not done anything since moved back home. Robaxin does not seem to help that much. PT is too costly. Tried tens unit but does not feel like it gives much benefit.   .. Active Ambulatory Problems    Diagnosis Date Noted  . Papillary thyroid carcinoma ([place]) 07/14/2017  . Pilonidal cyst 07/14/2017  . Attention deficit hyperactivity disorder (ADHD), combined type 07/14/2017  . No energy 07/14/2017  . Constipation 07/14/2017  . Current smoker 07/14/2017  . Low serum vitamin B12 07/15/2017  . Vitamin D insufficiency 07/15/2017  . Low iron stores 07/15/2017  . Depression, recurrent ([place]) 09/08/2017  . Anxiety 09/08/2017  . Stress reaction 09/08/2017  . Mid-back pain, acute 09/08/2017  . Lumbar degenerative disc disease 04/15/2018  . Cervical radiculopathy 09/01/2018  . Neck pain 09/01/2018   Resolved Ambulatory Problems    Diagnosis Date Noted  . No Resolved Ambulatory Problems   Past Medical History:  Diagnosis Date  . 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  Objective:   Physical Exam Vitals signs reviewed.  Constitutional:      Appearance: Normal appearance.  HENT:     Head: Normocephalic.  Cardiovascular:     Rate and Rhythm: Normal rate and regular rhythm.     Pulses: Normal pulses.  Pulmonary:     Effort: Pulmonary effort is normal.     Breath sounds: Normal breath  sounds.  Musculoskeletal:     Comments: NROm of neck.  No cspine tenderness to palpation.  5/5 strength upper bilateral ext.    Neurological:     General: No focal deficit present.     Mental Status: She is alert.           Assessment & Plan:  Marland KitchenMarland KitchenThai was seen today for follow-up.  Diagnoses and all orders for this visit:  Attention deficit hyperactivity disorder (ADHD), combined type -     amphetamine-dextroamphetamine (ADDERALL) 15 MG tablet; Take 1 tablet by mouth 2 (two) times daily. -     amphetamine-dextroamphetamine (ADDERALL) 15 MG tablet; Take 1 tablet by mouth 2 (two) times daily. -     amphetamine-dextroamphetamine (ADDERALL) 15 MG tablet; Take 1 tablet by mouth 2 (two) times daily.  Depression, recurrent ([place]) -     escitalopram (LEXAPRO) 10 MG tablet; Take 1 tablet (10 mg total) by mouth daily.  Anxiety -     escitalopram (LEXAPRO) 10 MG tablet; Take 1 tablet (10 mg total) by mouth daily.  Stress reaction -     escitalopram (LEXAPRO) 10 MG tablet; Take 1 tablet (10 mg total) by mouth daily.  Acute bilateral thoracic back pain -     naproxen (NAPROSYN) 500 MG tablet; Take 1 tablet (500 mg total) by mouth 2 (two) times daily as needed for moderate pain.  Neck pain -  DG Cervical Spine Complete -     predniSONE (DELTASONE) 50 MG tablet; One tab PO daily for 5 days.  Cervical radiculopathy -     DG Cervical Spine Complete -     predniSONE (DELTASONE) 50 MG tablet; One tab PO daily for 5 days.  refilled adderall for 3 months.   .. Depression screen [facility] 2/9 09/01/2018 04/15/2018 09/08/2017 07/14/2017  Decreased Interest 1 1 2  0  Down, Depressed, Hopeless 1 1 1 1   PHQ - 2 Score 2 2 3 1   Altered sleeping 0 0 2 -  Tired, decreased energy - 1 1 -  Change in appetite 0 2 1 -  Feeling bad or failure about yourself  0 1 1 -  Trouble concentrating 0 0 0 -  Moving slowly or fidgety/restless - 0 0 -  Suicidal thoughts 0 0 0 -  PHQ-9 Score 2 6 8  -  Difficult  doing work/chores Not difficult at all Somewhat difficult Very difficult -   .. GAD 7 : Generalized Anxiety Score 09/01/2018 04/15/2018 09/08/2017  Nervous, Anxious, on Edge 1 1 2   Control/stop worrying 1 1 2   Worry too much - different things 1 1 1   Trouble relaxing - 0 1  Restless 0 0 0  Easily annoyed or irritable 1 1 2   Afraid - awful might happen 0 1 1  Total GAD 7 Score - 5 9  Anxiety Difficulty Not difficult at all Somewhat difficult Very difficult    Refilled lexapro.   Neck pain and right arm numbness and tingling sounds like radicular pain. Prednisone burst given. Continue naproxen. Given neck exercises. Consider massage and chiropractor.we could consider EMG in the future.ordered xrays.   Reminded patient needs pap and TDap. Consider CPE appt.   Marland Kitchen.Spent 30 minutes with patient and greater than 50 percent of visit spent counseling patient regarding treatment plan.

## 2018-08-31 NOTE — Progress Notes (Signed)
Call pt: mild degenerative changes to the spine at C4/5 and C5/6. Certainly could be causing some of your radicular pain. If not improving with massage/tens/prednisone then consider MRI and sports med and possible injection.

## 2018-09-01 ENCOUNTER — Encounter: Payer: Self-pay | Admitting: Physician Assistant

## 2018-09-01 DIAGNOSIS — M5412 Radiculopathy, cervical region: Secondary | ICD-10-CM | POA: Insufficient documentation

## 2018-09-01 DIAGNOSIS — M542 Cervicalgia: Secondary | ICD-10-CM | POA: Insufficient documentation

## 2018-09-01 DIAGNOSIS — G8929 Other chronic pain: Secondary | ICD-10-CM | POA: Insufficient documentation

## 2018-09-03 ENCOUNTER — Encounter: Payer: Self-pay | Admitting: Physician Assistant

## 2018-09-28 ENCOUNTER — Other Ambulatory Visit: Payer: Self-pay | Admitting: Physician Assistant

## 2018-09-28 DIAGNOSIS — F902 Attention-deficit hyperactivity disorder, combined type: Secondary | ICD-10-CM

## 2018-09-29 ENCOUNTER — Telehealth: Payer: Self-pay | Admitting: Neurology

## 2018-09-29 ENCOUNTER — Other Ambulatory Visit: Payer: Self-pay | Admitting: Physician Assistant

## 2018-09-29 DIAGNOSIS — F902 Attention-deficit hyperactivity disorder, combined type: Secondary | ICD-10-CM

## 2018-09-29 NOTE — Telephone Encounter (Signed)
Patient called about Adderall denial. Medication was sent to be filled tomorrow, today was too soon. Called patient and made her aware. She expressed understanding.

## 2018-09-30 NOTE — Telephone Encounter (Signed)
Approved today (adderall) Your PA request has been approved. Additional information will be provided in the approval communication. Pharmacy aware. Pharmacy aware. Patient made aware.

## 2018-10-13 ENCOUNTER — Emergency Department
Admission: EM | Admit: 2018-10-13 | Discharge: 2018-10-13 | Payer: Federal, State, Local not specified - PPO | Source: Home / Self Care

## 2018-10-13 ENCOUNTER — Other Ambulatory Visit: Payer: Self-pay

## 2018-10-13 ENCOUNTER — Ambulatory Visit (INDEPENDENT_AMBULATORY_CARE_PROVIDER_SITE_OTHER): Payer: Federal, State, Local not specified - PPO | Admitting: Sports Medicine

## 2018-10-13 DIAGNOSIS — S61012A Laceration without foreign body of left thumb without damage to nail, initial encounter: Secondary | ICD-10-CM | POA: Diagnosis not present

## 2018-10-13 MED ORDER — HYDROCODONE-ACETAMINOPHEN 5-325 MG PO TABS: 1.0000 | ORAL_TABLET | Freq: Three times a day (TID) | ORAL | 0 refills | Status: DC | PRN

## 2018-10-13 NOTE — Assessment & Plan Note (Signed)
Distal thumb pad laceration. #2, 5-0 Ethilon sutures placed. This will heal well. Return to see me in 2 weeks. Hydrocodone for pain. 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

## 2018-10-13 NOTE — Progress Notes (Signed)
Subjective:    CC: Laceration  HPI: This is a pleasant 33 year old female, several days ago she had a laceration to her left distal thumb pad, was seen in the emergency department, Dermabond was applied.  Unfortunately she feels that the wound does not look good, she is having some pain, and it looks as though it is dehisced.  Pain is mild, persistent, localized without radiation, no drainage, no constitutional symptoms.  I reviewed the past medical history, family history, social history, surgical history, and allergies today and no changes were needed.  Please see the problem list section below in epic for further details.  Past Medical History: Past Medical History:  Diagnosis Date  . 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  Thyroidectomy Left 2015   Social History: Social History   Socioeconomic History  . Marital status: Legally Separated    Spouse name: Not on file  . Number of children: Not on file  . Years of education: Not on file  . Highest education level: Not on file  Occupational History  . Not on file  Social Needs  . Financial resource strain: Not on file  . Food insecurity:    Worry: Not on file    Inability: Not on file  . Transportation needs:    Medical: Not on file    Non-medical: Not on file  Tobacco Use  . Smoking status: Unknown If Ever Smoked  . Smokeless tobacco: Never Used  Substance and Sexual Activity  . Alcohol use: Not on file  . Drug use: Not on file  . Sexual activity: Not on file  Lifestyle  . Physical activity:    Days per week: Not on file    Minutes per session: Not on file  . Stress: Not on file  Relationships  . Social connections:    Talks on phone: Not on file    Gets together: Not on file    Attends religious service: Not on file    Active member of club or organization: Not on file    Attends meetings of clubs or organizations: Not on file   Relationship status: Not on file  Other Topics Concern  . Not on file  Social History Narrative  . Not on file   Family History: No family history on file. Allergies: No Known Allergies Medications: See med rec.  Review of Systems: No fevers, chills, night sweats, weight loss, chest pain, or shortness of breath.   Objective:    General: Well Developed, well nourished, and in no acute distress.  Neuro: Alert and oriented x3, extra-ocular muscles intact, sensation grossly intact.  HEENT: Normocephalic, atraumatic, pupils equal round reactive to light, neck supple, no masses, no lymphadenopathy, thyroid nonpalpable.  Skin: Warm and dry, no rashes. Cardiac: Regular rate and rhythm, no murmurs rubs or gallops, no lower extremity edema.  Respiratory: Clear to auscultation bilaterally. Not using accessory muscles, speaking in full sentences. Left hand: There is a 2.6 cm laceration in her distal thumb pad.  Does not appear to cause any vascular or nerve injury.  No injury to the nail bed or matrix.  No erythema, no drainage.  Laceration repair: Indication: Laceration Location: Left distal thumb Size: 2.6 cm Anesthesia: None needed Wound explored, irrigated, FB removed (if present) Type of suture material: 5-0 Ethilon Number of sutures: #2 simple interrupted Tolerated well Routine postprocedure instructions d/w pt- keep area clean and bandaged, follow up if  concerns/spreading erythema/pain.   Follow up for for suture removal.  Impression and Recommendations:    Laceration of thumb, left Distal thumb pad laceration. #2, 5-0 Ethilon sutures placed. This will heal well. Return to see me in 2 weeks. Hydrocodone for pain. 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   ___________________________________________ Gwen Her. Dianah Field, M.D., ABFM., CAQSM. Primary Care and Sports Medicine [facility]  Adjunct Professor of [place] of [facility] of Medicine

## 2018-10-27 ENCOUNTER — Ambulatory Visit (INDEPENDENT_AMBULATORY_CARE_PROVIDER_SITE_OTHER): Payer: Federal, State, Local not specified - PPO | Admitting: Sports Medicine

## 2018-10-27 ENCOUNTER — Encounter: Payer: Self-pay | Admitting: Sports Medicine

## 2018-10-27 DIAGNOSIS — L0201 Cutaneous abscess of face: Secondary | ICD-10-CM | POA: Diagnosis not present

## 2018-10-27 DIAGNOSIS — S61012D Laceration without foreign body of left thumb without damage to nail, subsequent encounter: Secondary | ICD-10-CM

## 2018-10-27 MED ORDER — DOXYCYCLINE HYCLATE 100 MG PO TABS: 100.0000 mg | ORAL_TABLET | Freq: Two times a day (BID) | ORAL | 0 refills | Status: DC

## 2018-10-27 NOTE — Progress Notes (Signed)
Subjective:    CC: Follow-up  HPI: Thumb laceration: Doing well.  Facial lesion: Had a small pimple, squeezed it, now it somewhat larger and more red.  Painful.  No fevers, chills, headaches, no visual changes.  Pain is moderate, persistent, localized without radiation.  Present now for several days.  I reviewed the past medical history, family history, social history, surgical history, and allergies today and no changes were needed.  Please see the problem list section below in epic for further details.  Past Medical History: Past Medical History:  Diagnosis Date  . 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  Thyroidectomy Left 2015   Social History: Social History   Socioeconomic History  . Marital status: Legally Separated    Spouse name: Not on file  . Number of children: Not on file  . Years of education: Not on file  . Highest education level: Not on file  Occupational History  . Not on file  Social Needs  . Financial resource strain: Not on file  . Food insecurity:    Worry: Not on file    Inability: Not on file  . Transportation needs:    Medical: Not on file    Non-medical: Not on file  Tobacco Use  . Smoking status: Unknown If Ever Smoked  . Smokeless tobacco: Never Used  Substance and Sexual Activity  . Alcohol use: Not on file  . Drug use: Not on file  . Sexual activity: Not on file  Lifestyle  . Physical activity:    Days per week: Not on file    Minutes per session: Not on file  . Stress: Not on file  Relationships  . Social connections:    Talks on phone: Not on file    Gets together: Not on file    Attends religious service: Not on file    Active member of club or organization: Not on file    Attends meetings of clubs or organizations: Not on file    Relationship status: Not on file  Other Topics Concern  . Not on file  Social History Narrative  . Not on file   Family  History: No family history on file. Allergies: No Known Allergies Medications: See med rec.  Review of Systems: No fevers, chills, night sweats, weight loss, chest pain, or shortness of breath.   Objective:    General: Well Developed, well nourished, and in no acute distress.  Neuro: Alert and oriented x3, extra-ocular muscles intact, sensation grossly intact.  HEENT: Normocephalic, atraumatic, pupils equal round reactive to light, neck supple, no masses, no lymphadenopathy, thyroid nonpalpable.  There is a small pustule with mild surrounding cellulitis and minimal fluctuance under the right cheek. Skin: Warm and dry, no rashes. Cardiac: Regular rate and rhythm, no murmurs rubs or gallops, no lower extremity edema.  Respiratory: Clear to auscultation bilaterally. Not using accessory muscles, speaking in full sentences. Left hand: Thumb laceration appears granulated and healed, there still is a rift in the skin but no further intervention is needed.  Impression and Recommendations:    Laceration of thumb, left Doing well, suture removed, I think she just needs to keep a Band-Aid on this thing for now 1.   Abscess of right external cheek 7 days of doxycycline, warm compresses. Video visit in 1 week to reevaluate.   ___________________________________________ Gwen Her. Dianah Field, M.D., ABFM., CAQSM. Primary Care and [place]  Adjunct Professor of [place] of [facility] of Medicine

## 2018-10-27 NOTE — Assessment & Plan Note (Signed)
Doing well, suture removed, I think she just needs to keep a Band-Aid on this thing for now 1.

## 2018-10-27 NOTE — Assessment & Plan Note (Signed)
7 days of doxycycline, warm compresses. Video visit in 1 week to reevaluate.

## 2018-10-29 ENCOUNTER — Other Ambulatory Visit: Payer: Self-pay | Admitting: Physician Assistant

## 2018-10-29 ENCOUNTER — Encounter: Payer: Self-pay | Admitting: Physician Assistant

## 2018-10-29 DIAGNOSIS — F902 Attention-deficit hyperactivity disorder, combined type: Secondary | ICD-10-CM

## 2018-11-03 ENCOUNTER — Telehealth (INDEPENDENT_AMBULATORY_CARE_PROVIDER_SITE_OTHER): Payer: Federal, State, Local not specified - PPO | Admitting: Sports Medicine

## 2018-11-03 DIAGNOSIS — L0201 Cutaneous abscess of face: Secondary | ICD-10-CM

## 2018-11-03 NOTE — Assessment & Plan Note (Signed)
Now completely resolved with antibiotics, return as needed.

## 2018-11-03 NOTE — Progress Notes (Signed)
Virtual Visit via WebEx/MyChart   I connected with  Charisse March  on 11/03/18 via WebEx/MyChart/Doximity Video and verified that I am speaking with the correct person using two identifiers.   I discussed the limitations, risks, security and privacy concerns of performing an evaluation and management service by WebEx/MyChart/Doximity Video, including the higher likelihood of inaccurate diagnosis and treatment, and the availability of in person appointments.  We also discussed the likely need of an additional face to face encounter for complete and high quality delivery of care.  I also discussed with the patient that there may be a patient responsible charge related to this service. The patient expressed understanding and wishes to proceed.  Provider location is either at home or medical facility. Patient location is at their home, different from provider location. People involved in care of the patient during this telehealth encounter were myself, my nurse/medical assistant, and my front office/scheduling team member.  Subjective:    CC: Follow-up  HPI: Facial abscess: Resolved with antibiotics.  I reviewed the past medical history, family history, social history, surgical history, and allergies today and no changes were needed.  Please see the problem list section below in epic for further details.  Past Medical History: Past Medical History:  Diagnosis Date  . 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  Thyroidectomy Left 2015   Social History: Social History   Socioeconomic History  . Marital status: Legally Separated    Spouse name: Not on file  . Number of children: Not on file  . Years of education: Not on file  . Highest education level: Not on file  Occupational History  . Not on file  Social Needs  . Financial resource strain: Not on file  . Food insecurity:    Worry: Not on file   Inability: Not on file  . Transportation needs:    Medical: Not on file    Non-medical: Not on file  Tobacco Use  . Smoking status: Unknown If Ever Smoked  . Smokeless tobacco: Never Used  Substance and Sexual Activity  . Alcohol use: Not on file  . Drug use: Not on file  . Sexual activity: Not on file  Lifestyle  . Physical activity:    Days per week: Not on file    Minutes per session: Not on file  . Stress: Not on file  Relationships  . Social connections:    Talks on phone: Not on file    Gets together: Not on file    Attends religious service: Not on file    Active member of club or organization: Not on file    Attends meetings of clubs or organizations: Not on file    Relationship status: Not on file  Other Topics Concern  . Not on file  Social History Narrative  . Not on file   Family History: No family history on file. Allergies: No Known Allergies Medications: See med rec.  Review of Systems: No fevers, chills, night sweats, weight loss, chest pain, or shortness of breath.   Objective:    General: Speaking full sentences, no audible heavy breathing.  Sounds alert and appropriately interactive.  Appears well.  Face symmetric.  Extraocular movements intact.  Pupils equal and round.  No nasal flaring or accessory muscle use visualized.  No other physical exam performed due to the non-physical nature of this visit. Face: Abscess appears resolved on the camera.  Impression and Recommendations:    Abscess of right external cheek Now completely resolved with antibiotics, return as needed.   I discussed the above assessment and treatment plan with the patient. The patient was provided an opportunity to ask questions and all were answered. The patient agreed with the plan and demonstrated an understanding of the instructions.   The patient was advised to call back or seek an in-person evaluation if the symptoms worsen or if the condition fails to improve as  anticipated.   I provided 25 minutes of non-face-to-face time during this encounter, 15 minutes of additional time was needed to gather information, review chart, records, communicate/coordinate with staff remotely, troubleshooting the multiple errors that we get every time when trying to do video calls through the electronic medical record, WebEx, and Doximity, restart the encounter multiple times due to instability of the software, as well as complete documentation.   ___________________________________________ Gwen Her. Dianah Field, M.D., ABFM., CAQSM. Primary Care and Sports Medicine [facility]  Adjunct Professor of [place] of [facility] of Medicine

## 2018-11-22 ENCOUNTER — Other Ambulatory Visit: Payer: Self-pay | Admitting: Physician Assistant

## 2018-11-22 DIAGNOSIS — M546 Pain in thoracic spine: Secondary | ICD-10-CM

## 2018-11-29 ENCOUNTER — Other Ambulatory Visit: Payer: Self-pay | Admitting: Physician Assistant

## 2018-11-29 DIAGNOSIS — F902 Attention-deficit hyperactivity disorder, combined type: Secondary | ICD-10-CM

## 2018-11-29 NOTE — Telephone Encounter (Signed)
Please call patient, needs appt for refills

## 2018-11-30 ENCOUNTER — Ambulatory Visit (INDEPENDENT_AMBULATORY_CARE_PROVIDER_SITE_OTHER): Payer: Federal, State, Local not specified - PPO | Admitting: Physician Assistant

## 2018-11-30 ENCOUNTER — Encounter: Payer: Self-pay | Admitting: Physician Assistant

## 2018-11-30 DIAGNOSIS — M5412 Radiculopathy, cervical region: Secondary | ICD-10-CM | POA: Diagnosis not present

## 2018-11-30 DIAGNOSIS — F902 Attention-deficit hyperactivity disorder, combined type: Secondary | ICD-10-CM | POA: Diagnosis not present

## 2018-11-30 MED ORDER — AMPHETAMINE-DEXTROAMPHETAMINE 15 MG PO TABS: 15.0000 mg | ORAL_TABLET | Freq: Two times a day (BID) | ORAL | 0 refills | Status: DC

## 2018-11-30 MED ORDER — HYDROCODONE-ACETAMINOPHEN 5-325 MG PO TABS: 1.0000 | ORAL_TABLET | Freq: Three times a day (TID) | ORAL | 0 refills | Status: DC | PRN

## 2018-11-30 MED ORDER — PREDNISONE 50 MG PO TABS: ORAL_TABLET | ORAL | 0 refills | Status: DC

## 2018-11-30 NOTE — Progress Notes (Deleted)
Patient needs adderall refills. Also having a lot of back pain lately. Requesting refills of hydrocodone.

## 2018-11-30 NOTE — Progress Notes (Signed)
Patient ID: Patricia Potter, female   DOB: 23-Mar-1986, 33 y.o.   MRN: [id number] .Marland KitchenVirtual Visit via Video Note  I connected with Patricia Potter on 11/30/18 at 11:30 AM EDT by a video enabled telemedicine application and verified that I am speaking with the correct person using two identifiers.  Location: Patient: home Provider: home   I discussed the limitations of evaluation and management by telemedicine and the availability of in person appointments. The patient expressed understanding and agreed to proceed.  History of Present Illness: Pt is a 33 yo female with ADHD who calls in to the clinic for 3 month follow up and medication refills.   She is doing great on adderall. No concerns or complaints. No palpitations, headaches, insomnia, or anxiety.   She is having some neck and right arm pain, numbness and tingling. Numbness and tingling extends into the right thumb, index, middle finger. She has had this before but usually went away during the day. This latest episode has been going on for a few days all day long. 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 No other known injury. She has been taking naprosyn and going to chiropractor. Seems to help some. She would like norco to use as needed for more intense pain.   .. Active Ambulatory Problems    Diagnosis Date Noted  . Papillary thyroid carcinoma ([place]) 07/14/2017  . Pilonidal cyst 07/14/2017  . Attention deficit hyperactivity disorder (ADHD), combined type 07/14/2017  . No energy 07/14/2017  . Constipation 07/14/2017  . Current smoker 07/14/2017  . Low serum vitamin B12 07/15/2017  . Vitamin D insufficiency 07/15/2017  . Low iron stores 07/15/2017  . Depression, recurrent ([place]) 09/08/2017  . Anxiety 09/08/2017  . Stress reaction 09/08/2017  . Mid-back pain, acute 09/08/2017  . Lumbar degenerative disc disease 04/15/2018  . Cervical radiculopathy 09/01/2018  . Neck pain 09/01/2018  . Laceration of thumb, left  10/13/2018  . Abscess of right external cheek 10/27/2018   Resolved Ambulatory Problems    Diagnosis Date Noted  . No Resolved Ambulatory Problems   Past Medical History:  Diagnosis Date  . 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   Observations/Objective: No acute distress. Normal mood and appearance.  Normal ROM at shoulder of right arm.  Normal ROm at neck.   .. Today's Vitals   There is no height or weight on file to calculate BMI.   Assessment and Plan: Marland KitchenMarland KitchenGlorianna was seen today for adhd.  Diagnoses and all orders for this visit:  Attention deficit hyperactivity disorder (ADHD), combined type -     amphetamine-dextroamphetamine (ADDERALL) 15 MG tablet; Take 1 tablet by mouth 2 (two) times daily. -     amphetamine-dextroamphetamine (ADDERALL) 15 MG tablet; Take 1 tablet by mouth 2 (two) times daily. -     amphetamine-dextroamphetamine (ADDERALL) 15 MG tablet; Take 1 tablet by mouth 2 (two) times daily.  Cervical radiculopathy -     HYDROcodone-acetaminophen (NORCO/VICODIN) 5-325 MG tablet; Take 1 tablet by mouth every 8 (eight) hours as needed for moderate pain. -     predniSONE (DELTASONE) 50 MG tablet; Take one tablet for 5 days.   Adderall refilled for 3 months.   Neck pain and right arm symptoms seem to be consistent with radiculopathy. Discussed exercises to start. Continue going to chiropractor. Encouraged icy hot patches, massages, tens unit for neck pain. Burst of prednisone given. Continue NSAIDs as needed. Only use norco for  breakthrough acute pain. Follow up with sports med if symptoms continue.   Marland KitchenMarland KitchenPDMP reviewed during this encounter. No concerns.   Follow Up Instructions:    I discussed the assessment and treatment plan with the patient. The patient was provided an opportunity to ask questions and all were answered. The patient agreed with the plan and demonstrated an understanding of the instructions.   The patient was  advised to call back or seek an in-person evaluation if the symptoms worsen or if the condition fails to improve as anticipated.  Iran Planas, PA-C

## 2018-12-20 ENCOUNTER — Telehealth: Payer: Federal, State, Local not specified - PPO | Admitting: Family

## 2018-12-20 DIAGNOSIS — R05 Cough: Secondary | ICD-10-CM

## 2018-12-20 DIAGNOSIS — R059 Cough, unspecified: Secondary | ICD-10-CM

## 2018-12-20 MED ORDER — BENZONATATE 100 MG PO CAPS: 100.0000 mg | ORAL_CAPSULE | Freq: Three times a day (TID) | ORAL | 0 refills | Status: DC | PRN

## 2018-12-20 NOTE — Progress Notes (Signed)
E-Visit for Corona Virus Screening   Your current symptoms could be consistent with the coronavirus.  Call your health care provider or local health department to request and arrange formal testing. Many health care providers can now test patients at their office but not all are.  Please quarantine yourself while awaiting your test results.  [place] [phone], [place], [place] [phone] or visit [email or web address]   Approximately 5 minutes was spent documenting and reviewing patient's chart.    COVID-19 is a respiratory illness with symptoms that are similar to the flu. Symptoms are typically mild to moderate, but there have been cases of severe illness and death due to the virus. The following symptoms may appear 2-14 days after exposure: . Fever . Cough . Shortness of breath or difficulty breathing . Chills . Repeated shaking with chills . Muscle pain . Headache . Sore throat . New loss of taste or smell . Fatigue . Congestion or runny nose . Nausea or vomiting . Diarrhea  It is vitally important that if you feel that you have an infection such as this virus or any other virus that you stay home and away from places where you may spread it to others.  You should self-quarantine for 14 days if you have symptoms that could potentially be coronavirus or have been in close contact a with a person diagnosed with COVID-19 within the last 2 weeks. You should avoid contact with people age 59 and older.   You should wear a mask or cloth face covering over your nose and mouth if you must be around other people or animals, including pets (even at home). Try to stay at least 6 feet away from other people. This will protect the people around you.  You can use medication such as A prescription cough medication called Tessalon Perles 100 mg. You may  take 1-2 capsules every 8 hours as needed for cough.  You may also take acetaminophen (Tylenol) as needed for fever.   Reduce your risk of any infection by using the same precautions used for avoiding the common cold or flu:  Marland Kitchen Wash your hands often with soap and warm water for at least 20 seconds.  If soap and water are not readily available, use an alcohol-based hand sanitizer with at least 60% alcohol.  . If coughing or sneezing, cover your mouth and nose by coughing or sneezing into the elbow areas of your shirt or coat, into a tissue or into your sleeve (not your hands). . Avoid shaking hands with others and consider head nods or verbal greetings only. . Avoid touching your eyes, nose, or mouth with unwashed hands.  . Avoid close contact with people who are sick. . Avoid places or events with large numbers of people in one location, like concerts or sporting events. . Carefully consider travel plans you have or are making. . If you are planning any travel outside or inside the [place], visit the CDC's Travelers' Health webpage for the latest health notices. . If you have some symptoms but not all symptoms, continue to monitor at home and seek medical attention if your symptoms worsen. . If you are having a medical emergency, call 911.  HOME CARE . Only take medications as instructed by your medical team. . Drink plenty of fluids and get plenty of rest. . A steam or ultrasonic humidifier can help if you have congestion.   GET HELP RIGHT AWAY IF YOU HAVE  EMERGENCY WARNING SIGNS** FOR COVID-19. If you or someone is showing any of these signs seek emergency medical care immediately. Call 911 or proceed to your closest emergency facility if: . You develop worsening high fever. . Trouble breathing . Bluish lips or face . Persistent pain or pressure in the chest . New confusion . Inability to wake or stay awake . You cough up blood. . Your symptoms become more severe  **This list is not all  possible symptoms. Contact your medical provider for any symptoms that are sever or concerning to you.   MAKE SURE YOU   Understand these instructions.  Will watch your condition.  Will get help right away if you are not doing well or get worse.  Your e-visit answers were reviewed by a board certified advanced clinical practitioner to complete your personal care plan.  Depending on the condition, your plan could have included both over the counter or prescription medications.  If there is a problem please reply once you have received a response from your provider.  Your safety is important to [place].  If you have drug allergies check your prescription carefully.    You can use MyChart to ask questions about today's visit, request a non-urgent call back, or ask for a work or school excuse for 24 hours related to this e-Visit. If it has been greater than 24 hours you will need to follow up with your provider, or enter a new e-Visit to address those concerns. You will get an e-mail in the next two days asking about your experience.  I hope that your e-visit has been valuable and will speed your recovery. Thank you for using e-visits.

## 2018-12-30 ENCOUNTER — Other Ambulatory Visit: Payer: Self-pay | Admitting: Physician Assistant

## 2018-12-30 DIAGNOSIS — F902 Attention-deficit hyperactivity disorder, combined type: Secondary | ICD-10-CM

## 2018-12-31 NOTE — Telephone Encounter (Signed)
Call pharmacy. I sent 3 month post dated in June 2020.

## 2019-01-04 NOTE — Telephone Encounter (Signed)
Called patient and notified via VM that med is at pharmacy to call pharmacy and ask to have paper rx refilled. KG LPN

## 2019-02-14 ENCOUNTER — Encounter: Payer: Self-pay | Admitting: Physician Assistant

## 2019-02-14 ENCOUNTER — Ambulatory Visit (INDEPENDENT_AMBULATORY_CARE_PROVIDER_SITE_OTHER): Payer: Federal, State, Local not specified - PPO | Admitting: Physician Assistant

## 2019-02-14 VITALS — BP 114/72 | HR 89 | Temp 98.8°F | Wt 146.0 lb

## 2019-02-14 DIAGNOSIS — M549 Dorsalgia, unspecified: Secondary | ICD-10-CM

## 2019-02-14 DIAGNOSIS — R110 Nausea: Secondary | ICD-10-CM

## 2019-02-14 DIAGNOSIS — R300 Dysuria: Secondary | ICD-10-CM

## 2019-02-14 DIAGNOSIS — R109 Unspecified abdominal pain: Secondary | ICD-10-CM

## 2019-02-14 LAB — POCT URINALYSIS DIPSTICK
Glucose, UA: POSITIVE — AB
Nitrite, UA: POSITIVE
Protein, UA: POSITIVE — AB
Spec Grav, UA: 1.015 (ref 1.010–1.025)
Urobilinogen, UA: 2 E.U./dL — AB
pH, UA: 5 (ref 5.0–8.0)

## 2019-02-14 LAB — POCT URINE PREGNANCY: Preg Test, Ur: NEGATIVE

## 2019-02-14 MED ORDER — CIPROFLOXACIN HCL 500 MG PO TABS: 500.0000 mg | ORAL_TABLET | Freq: Two times a day (BID) | ORAL | 0 refills | Status: AC

## 2019-02-14 MED ORDER — KETOROLAC TROMETHAMINE 30 MG/ML IJ SOLN
15.0000 mg | Freq: Once | INTRAMUSCULAR | Status: AC
  Administered 2019-02-14: 15 mg via INTRAMUSCULAR

## 2019-02-14 MED ORDER — ONDANSETRON 4 MG PO TBDP: 4.0000 mg | ORAL_TABLET | Freq: Three times a day (TID) | ORAL | 0 refills | Status: DC | PRN

## 2019-02-14 NOTE — Patient Instructions (Signed)
Pyelonephritis, Adult Pyelonephritis is an infection that occurs in the kidney. The kidneys are the organs that filter a person's blood and move waste out of the bloodstream and into the urine. Urine passes from the kidneys, through tubes called ureters, and into the bladder. There are two main types of pyelonephritis:  Infections that come on quickly without any warning (acute pyelonephritis).  Infections that last for a long period of time (chronic pyelonephritis). In most cases, the infection clears up with treatment and does not cause further problems. More severe infections or chronic infections can sometimes spread to the bloodstream or lead to other problems with the kidneys. What are the causes? This condition is usually caused by:  Bacteria traveling from the bladder up to the kidney. This may occur after having a bladder infection (cystitis) or urinary tract infection (UTI).  Bladder infections caused from bacteria traveling from the bloodstream to the kidney. What increases the risk? This condition is more likely to develop in:  Pregnant women.  Older people.  People who have any of these conditions: ? Diabetes. ? Inflammation of the prostate gland (prostatitis), in males. ? Kidney stones or bladder stones. ? Other abnormalities of the kidney or ureter. ? Cancer.  People who have a catheter placed in the bladder.  People who are sexually active.  Women who use spermicides.  People who have had a prior UTI. What are the signs or symptoms? Symptoms of this condition include:  Frequent urination.  Strong or persistent urge to urinate.  Burning or stinging when urinating.  Abdominal pain.  Back pain.  Pain in the side or flank area.  Fever or chills.  Blood in the urine, or dark urine.  Nausea or vomiting. How is this diagnosed? This condition may be diagnosed based on:  Your medical history and a physical exam.  Urine tests.  Blood tests. You may  also have imaging tests of the kidneys, such as an ultrasound or CT scan. How is this treated? Treatment for this condition may depend on the severity of the infection.  If the infection is mild and is found early, you may be treated with antibiotic medicines taken by mouth (orally). You will need to drink fluids to remain hydrated.  If the infection is more severe, you may need to stay in the hospital and receive antibiotics given directly into a vein through an IV. You may also need to receive fluids through an IV if you are not able to remain hydrated. After your hospital stay, you may need to take oral antibiotics for a period of time. Other treatments may be required, depending on the cause of the infection. Follow these instructions at home: Medicines  Take your antibiotic medicine as told by your health care provider. Do not stop taking the antibiotic even if you start to feel better.  Take over-the-counter and prescription medicines only as told by your health care provider. General instructions   Drink enough fluid to keep your urine pale yellow.  Avoid caffeine, tea, and carbonated beverages. They tend to irritate the bladder.  Urinate often. Avoid holding in urine for long periods of time.  Urinate before and after sex.  After a bowel movement, women should cleanse from front to back. Use each tissue only once.  Keep all follow-up visits as told by your health care provider. This is important. Contact a health care provider if:  Your symptoms do not get better after 2 days of treatment.  Your symptoms get worse.  You have a fever. Get help right away if you:  Are unable to take your antibiotics or fluids.  Have shaking chills.  Vomit.  Have severe flank or back pain.  Have extreme weakness or fainting. Summary  Pyelonephritis is a urinary tract infection (UTI) that occurs in the kidney.  Treatment for this condition may depend on the severity of the  infection.  Take your antibiotic medicine as told by your health care provider. Do not stop taking the antibiotic even if you start to feel better.  Drink enough fluid to keep your urine pale yellow.  Keep all follow-up visits as told by your health care provider. This is important. This information is not intended to replace advice given to you by your health care provider. Make sure you discuss any questions you have with your health care provider. Document Released: 06/02/2005 Document Revised: 04/06/2018 Document Reviewed: 04/06/2018 Elsevier Patient Education  2020 Elsevier Inc.  

## 2019-02-14 NOTE — Progress Notes (Signed)
HPI:                                                                Patricia Potter is a 33 y.o. female who presents to [facility] today for UTI symptoms  Patient with PMH of nephrolithiasis (2012) reports she had unprotected sex with boyfriend on Friday and developed dysuria, urgency/frequency, right-sided abdominal and right-sided flank pain 2 days ago. Symptoms are gradually worsening. Endorses intermittent nausea. Denies fever, chills, vomiting, hematuria. Denies vaginal discharge/pruritius. Denies hx of STI.  Denies hx of pyelonephritis LMP 01/20/19 Took Azo today with mild relief.      Past Medical History:  Diagnosis Date  . 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  Thyroidectomy Left 2015   Social History   Tobacco Use  . Smoking status: Unknown If Ever Smoked  . Smokeless tobacco: Never Used  Substance Use Topics  . Alcohol use: Not on file   family history is not on file.    ROS: negative except as noted in the HPI  Medications: Current Outpatient Medications  Medication Sig Dispense Refill  . amphetamine-dextroamphetamine (ADDERALL) 15 MG tablet Take 1 tablet by mouth 2 (two) times daily. 60 tablet 0  . escitalopram (LEXAPRO) 10 MG tablet Take 1 tablet (10 mg total) by mouth daily. 90 tablet 4  . naproxen (NAPROSYN) 500 MG tablet TAKE 1 TABLET (500 MG TOTAL) BY MOUTH 2 (TWO) TIMES DAILY AS NEEDED FOR MODERATE PAIN. 60 tablet 2  . ciprofloxacin (CIPRO) 500 MG tablet Take 1 tablet (500 mg total) by mouth 2 (two) times daily for 7 days. 14 tablet 0  . ondansetron (ZOFRAN-ODT) 4 MG disintegrating tablet Take 1 tablet (4 mg total) by mouth every 8 (eight) hours as needed for nausea or vomiting. 6 tablet 0   No current facility-administered medications for this visit.    No Known Allergies     Objective:  BP 114/72   Pulse 89   Temp 98.8 F (37.1 C) (Oral)   Wt  146 lb (66.2 kg)   LMP 01/20/2019 (Exact Date)   BMI 26.70 kg/m  Gen:  alert, laying on exam table, appears uncomfortable, no acute distress, non-toxic appearing, appropriate for age 49: head normocephalic without obvious abnormality, conjunctiva and cornea clear, trachea midline Pulm: Normal work of breathing, normal phonation GI: abdomen soft, right-sided abdominal tenderness without guarding, right CVA tenderness Neuro: alert and oriented x 3, no tremor MSK: extremities atraumatic, normal gait and station Skin: intact, no rashes on exposed skin, no jaundice, no cyanosis    Results for orders placed or performed in visit on 02/14/19 (from the past 72 hour(s))  POCT Urinalysis Dipstick     Status: Abnormal   Collection Time: 02/14/19  3:41 PM  Result Value Ref Range   Color, UA red    Clarity, UA     Glucose, UA Positive (A) Negative   Bilirubin, UA small    Ketones, UA trace    Spec Grav, UA 1.015 1.010 - 1.025   Blood, UA moderate    pH, UA 5.0 5.0 - 8.0   Protein, UA Positive (A) Negative   Urobilinogen, UA 2.0 (A) 0.2 or 1.0  E.U./dL   Nitrite, UA positive    Leukocytes, UA Large (3+) (A) Negative   Appearance     Odor    POCT urine pregnancy     Status: Normal   Collection Time: 02/14/19  3:49 PM  Result Value Ref Range   Preg Test, Ur Negative Negative   No results found.    Assessment and Plan: 33 y.o. female with   .Diagnoses and all orders for this visit:  Right flank pain -     POCT Urinalysis Dipstick -     Urine Culture -     Urine Microscopic; Future -     Urine Microscopic -     POCT urine pregnancy -     ciprofloxacin (CIPRO) 500 MG tablet; Take 1 tablet (500 mg total) by mouth 2 (two) times daily for 7 days.  Costovertebral angle tenderness -     ciprofloxacin (CIPRO) 500 MG tablet; Take 1 tablet (500 mg total) by mouth 2 (two) times daily for 7 days.  Dysuria -     ciprofloxacin (CIPRO) 500 MG tablet; Take 1 tablet (500 mg total) by mouth  2 (two) times daily for 7 days.  Nausea without vomiting -     ondansetron (ZOFRAN-ODT) 4 MG disintegrating tablet; Take 1 tablet (4 mg total) by mouth every 8 (eight) hours as needed for nausea or vomiting.    Patient afebrile, mildly ill appearing, with right CVA tenderness DDx includes early mild pyelonephritis versus renal colic.  UA nondiagnostic due to use of Azo Urine hCG negative Urine micro and cx pending Toradol 15 mg given in office today for flank pain Treating empirically for early pyelo with Cipro 500 mg bid x 7 days Zofran prn nausea  Patient education and anticipatory guidance given Patient agrees with treatment plan Follow-up with PCP in 2 days or sooner as needed if symptoms worsen or fail to improve  Darlyne Russian PA-C

## 2019-02-15 LAB — URINALYSIS, MICROSCOPIC ONLY
Hyaline Cast: NONE SEEN /LPF
RBC / HPF: >=60 /HPF — AB (ref 0–2)

## 2019-02-16 ENCOUNTER — Ambulatory Visit: Payer: Federal, State, Local not specified - PPO | Admitting: Physician Assistant

## 2019-02-16 LAB — URINE CULTURE
MICRO NUMBER:: 829697
SPECIMEN QUALITY:: ADEQUATE

## 2019-03-10 ENCOUNTER — Other Ambulatory Visit: Payer: Self-pay | Admitting: Physician Assistant

## 2019-03-10 DIAGNOSIS — F902 Attention-deficit hyperactivity disorder, combined type: Secondary | ICD-10-CM

## 2019-03-11 ENCOUNTER — Other Ambulatory Visit: Payer: Self-pay | Admitting: Physician Assistant

## 2019-03-11 ENCOUNTER — Encounter: Payer: Self-pay | Admitting: Physician Assistant

## 2019-03-11 DIAGNOSIS — F902 Attention-deficit hyperactivity disorder, combined type: Secondary | ICD-10-CM

## 2019-03-11 NOTE — Telephone Encounter (Signed)
[facility] requesting med refill for adderall.

## 2019-03-14 MED ORDER — AMPHETAMINE-DEXTROAMPHETAMINE 15 MG PO TABS: 15.0000 mg | ORAL_TABLET | Freq: Two times a day (BID) | ORAL | 0 refills | Status: DC

## 2019-03-14 NOTE — Telephone Encounter (Signed)
..  PDMP not reviewed this encounter.  Seen 11/2018. Should have had 3 months. Per pharmacy she does not. Sent 1 month refill.

## 2019-03-17 ENCOUNTER — Encounter: Payer: Self-pay | Admitting: Physician Assistant

## 2019-03-17 DIAGNOSIS — F902 Attention-deficit hyperactivity disorder, combined type: Secondary | ICD-10-CM

## 2019-03-18 NOTE — Telephone Encounter (Signed)
Patient called yesterday, states she is out of own and wants her Adderall sent to different pharmacy. I called [facility], they stated patient has not picked up RX yet. I left VM to cancel RX due to being on hold for over 30 minutes with no answer.    I will call local pharmacy and cancel RX, please send RX which is pended.

## 2019-03-20 MED ORDER — AMPHETAMINE-DEXTROAMPHETAMINE 15 MG PO TABS: 15.0000 mg | ORAL_TABLET | Freq: Two times a day (BID) | ORAL | 0 refills | Status: DC

## 2019-03-30 DIAGNOSIS — Z20828 Contact with and (suspected) exposure to other viral communicable diseases: Secondary | ICD-10-CM | POA: Diagnosis not present

## 2019-04-18 ENCOUNTER — Telehealth: Payer: Self-pay | Admitting: Physician Assistant

## 2019-04-18 DIAGNOSIS — F902 Attention-deficit hyperactivity disorder, combined type: Secondary | ICD-10-CM

## 2019-04-19 NOTE — Telephone Encounter (Signed)
Please contact patient to schedule appt.

## 2019-04-19 NOTE — Telephone Encounter (Signed)
Patient will get vitals and she will do virtual tomorrow morning. No further questions at this time.

## 2019-04-19 NOTE — Telephone Encounter (Signed)
Virtual is ok for now. Make sure get vitals for chart.

## 2019-04-19 NOTE — Telephone Encounter (Signed)
Patient has moved out of [place] and can not make an in person visit. Would like to know if she can make a virtual visit for a follow up on meds. She is looking for a new Primary care physician but she just hasn't found anyone yet. Please advise.

## 2019-04-20 ENCOUNTER — Ambulatory Visit (INDEPENDENT_AMBULATORY_CARE_PROVIDER_SITE_OTHER): Payer: Federal, State, Local not specified - PPO | Admitting: Physician Assistant

## 2019-04-20 ENCOUNTER — Encounter: Payer: Self-pay | Admitting: Physician Assistant

## 2019-04-20 VITALS — BP 116/76 | HR 93 | Temp 97.9°F | Ht 62.0 in | Wt 142.0 lb

## 2019-04-20 DIAGNOSIS — F419 Anxiety disorder, unspecified: Secondary | ICD-10-CM

## 2019-04-20 DIAGNOSIS — M549 Dorsalgia, unspecified: Secondary | ICD-10-CM

## 2019-04-20 DIAGNOSIS — F902 Attention-deficit hyperactivity disorder, combined type: Secondary | ICD-10-CM | POA: Diagnosis not present

## 2019-04-20 DIAGNOSIS — F339 Major depressive disorder, recurrent, unspecified: Secondary | ICD-10-CM | POA: Diagnosis not present

## 2019-04-20 MED ORDER — AMPHETAMINE-DEXTROAMPHETAMINE 15 MG PO TABS: 15.0000 mg | ORAL_TABLET | Freq: Two times a day (BID) | ORAL | 0 refills | Status: DC

## 2019-04-20 MED ORDER — METHOCARBAMOL 500 MG PO TABS: 500.0000 mg | ORAL_TABLET | Freq: Three times a day (TID) | ORAL | 1 refills | Status: DC

## 2019-04-20 MED ORDER — ESCITALOPRAM OXALATE 20 MG PO TABS: 20.0000 mg | ORAL_TABLET | Freq: Every day | ORAL | 0 refills | Status: DC

## 2019-04-20 NOTE — Progress Notes (Signed)
Need refills.  Wants to discuss increasing dose of antidepressant. PHQ9-GAD7 completed.  Wants RX for methocarbamol for shoulder (used to be prescribed).
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
## 2019-04-29 ENCOUNTER — Inpatient Hospital Stay: Admission: RE | Admit: 2019-04-29 | Payer: Federal, State, Local not specified - PPO | Source: Ambulatory Visit

## 2019-05-31 ENCOUNTER — Other Ambulatory Visit: Payer: Self-pay | Admitting: Physician Assistant

## 2019-05-31 DIAGNOSIS — F902 Attention-deficit hyperactivity disorder, combined type: Secondary | ICD-10-CM

## 2019-05-31 MED ORDER — AMPHETAMINE-DEXTROAMPHETAMINE 15 MG PO TABS: 15.0000 mg | ORAL_TABLET | Freq: Two times a day (BID) | ORAL | 0 refills | Status: DC

## 2019-06-29 ENCOUNTER — Other Ambulatory Visit: Payer: Self-pay | Admitting: Physician Assistant

## 2019-06-29 DIAGNOSIS — F419 Anxiety disorder, unspecified: Secondary | ICD-10-CM

## 2019-06-29 DIAGNOSIS — F902 Attention-deficit hyperactivity disorder, combined type: Secondary | ICD-10-CM

## 2019-06-29 DIAGNOSIS — F339 Major depressive disorder, recurrent, unspecified: Secondary | ICD-10-CM

## 2019-06-29 NOTE — Telephone Encounter (Signed)
RF for Adderall not appropriate at this time.

## 2019-06-30 ENCOUNTER — Other Ambulatory Visit: Payer: Self-pay | Admitting: Physician Assistant

## 2019-06-30 DIAGNOSIS — F419 Anxiety disorder, unspecified: Secondary | ICD-10-CM

## 2019-06-30 DIAGNOSIS — F339 Major depressive disorder, recurrent, unspecified: Secondary | ICD-10-CM

## 2019-06-30 DIAGNOSIS — F902 Attention-deficit hyperactivity disorder, combined type: Secondary | ICD-10-CM

## 2019-07-01 MED ORDER — AMPHETAMINE-DEXTROAMPHETAMINE 15 MG PO TABS: 15.0000 mg | ORAL_TABLET | Freq: Two times a day (BID) | ORAL | 0 refills | Status: DC

## 2019-07-01 MED ORDER — ESCITALOPRAM OXALATE 20 MG PO TABS: 20.0000 mg | ORAL_TABLET | Freq: Every day | ORAL | 0 refills | Status: DC

## 2019-07-01 NOTE — Telephone Encounter (Signed)
Last appt 04/20/2019  Last filled 01/04/202. TOO early for refill. Please send denial.

## 2019-07-24 ENCOUNTER — Encounter: Payer: Self-pay | Admitting: Physician Assistant

## 2019-07-25 MED ORDER — HYDROCORTISONE (PERIANAL) 2.5 % EX CREA: 1.0000 "application " | TOPICAL_CREAM | Freq: Two times a day (BID) | CUTANEOUS | 0 refills | Status: DC

## 2019-07-26 ENCOUNTER — Ambulatory Visit (INDEPENDENT_AMBULATORY_CARE_PROVIDER_SITE_OTHER): Payer: Federal, State, Local not specified - PPO | Admitting: Physician Assistant

## 2019-07-26 ENCOUNTER — Encounter (INDEPENDENT_AMBULATORY_CARE_PROVIDER_SITE_OTHER): Payer: Self-pay

## 2019-07-26 ENCOUNTER — Other Ambulatory Visit: Payer: Self-pay

## 2019-07-26 VITALS — BP 120/79 | HR 100 | Ht 62.0 in | Wt 146.0 lb

## 2019-07-26 DIAGNOSIS — K644 Residual hemorrhoidal skin tags: Secondary | ICD-10-CM | POA: Diagnosis not present

## 2019-07-26 DIAGNOSIS — K6289 Other specified diseases of anus and rectum: Secondary | ICD-10-CM

## 2019-07-26 DIAGNOSIS — R102 Pelvic and perineal pain: Secondary | ICD-10-CM

## 2019-07-26 DIAGNOSIS — N898 Other specified noninflammatory disorders of vagina: Secondary | ICD-10-CM

## 2019-07-26 MED ORDER — HYDROCODONE-ACETAMINOPHEN 5-325 MG PO TABS: 1.0000 | ORAL_TABLET | Freq: Four times a day (QID) | ORAL | 0 refills | Status: AC | PRN

## 2019-07-26 MED ORDER — DOXYCYCLINE HYCLATE 100 MG PO TABS: 100.0000 mg | ORAL_TABLET | Freq: Two times a day (BID) | ORAL | 0 refills | Status: DC

## 2019-07-26 MED ORDER — HYDROCODONE-ACETAMINOPHEN 5-325 MG PO TABS: 1.0000 | ORAL_TABLET | Freq: Four times a day (QID) | ORAL | 0 refills | Status: DC | PRN

## 2019-07-26 NOTE — Patient Instructions (Signed)
Bartholin's Cyst  A Bartholin's cyst is a fluid-filled sac that forms on a Bartholin's gland. Bartholin's glands are small glands in the folds of skin around the opening of the vagina (labia). This type of cyst causes a bulge or lump near the lower opening of the vagina. If you have a cyst that is small and not infected, you may be able to take care of it at home. If your cyst gets infected, it may cause pain and your doctor may need to drain it. An infected Bartholin's cyst is called a Bartholin's abscess. Follow these instructions at home: Medicines  Take over-the-counter and prescription medicines only as told by your doctor.  If you were prescribed an antibiotic medicine, take it as told by your doctor. Do not stop taking the antibiotic even if you start to feel better. Managing pain and swelling  Try sitz baths to help with pain and swelling. A sitz bath is a warm water bath in which the water only comes up to your hips and should cover your buttocks. You may take sitz baths a few times a day.  Put heat on the affected area as often as needed. Use the heat source that your doctor recommends, such as a moist heat pack or a heating pad. ? Place a towel between your skin and the heat source. ? Leave the heat on for 20-30 minutes. ? Remove the heat if your skin turns bright red. This is especially important if you cannot feel pain, heat, or cold. You may have a greater risk of getting burned. General instructions  If your cyst or abscess was drained: ? Follow instructions from your doctor about how to take care of your wound. ? Use feminine pads to absorb any fluid.  Do not push on or squeeze your cyst.  Do not have sex until the cyst has gone away or your wound from drainage has healed.  Take these steps to help prevent a Bartholin's cyst from returning, and to prevent other Bartholin's cysts from forming: ? Take a bath or shower once a day. Clean your vaginal area with mild soap and  water when you bathe. ? Practice safe sex to prevent STIs (sexually transmitted infections). Talk with your doctor about how to prevent STIs and which forms of birth control (contraception) may be best for you.  Keep all follow-up visits as told by your doctor. This is important. Contact a doctor if:  You have a fever.  You get redness, swelling, or pain around your cyst.  You have fluid, blood, pus, or a bad smell coming from your cyst.  You have a cyst that gets larger or comes back. Summary  A Bartholin's cyst is a fluid-filled sac that forms on a Bartholin's gland. These small glands are found in the folds of skin around the opening of the vagina (labia).  This type of cyst causes a bulge or lump near the lower opening of the vagina. An infected Bartholin's cyst is called a Bartholin's abscess.  Try sitz baths a few times a day to help with pain and swelling.  Do not push on or squeeze your cyst. This information is not intended to replace advice given to you by your health care provider. Make sure you discuss any questions you have with your health care provider. Document Revised: 03/25/2018 Document Reviewed: 03/04/2017 Elsevier Patient Education  2020 Elsevier Inc.  

## 2019-07-27 ENCOUNTER — Encounter: Payer: Self-pay | Admitting: Physician Assistant

## 2019-07-27 LAB — C. TRACHOMATIS/N. GONORRHOEAE RNA
C. trachomatis RNA, TMA: NOT DETECTED
N. gonorrhoeae RNA, TMA: NOT DETECTED

## 2019-07-27 NOTE — Progress Notes (Signed)
Subjective:    Patient ID: Charisse March, female    DOB: [birth year], 34 y.o.   MRN: [id number]  HPI  Pt is a 34 yo female who presents to the clinic with what she believes is a painful hemorrhoid. She has had hemorrhoids with her children but does not usually have problem with them. She denies any anal sex, constipation, heavy lifting or bowel straining. She does admit that the pain from rectum radiates into vagina and left labia. No fever, chills, nausea, vomiting. She is using hemorrhoid cream, sitz baths and witch hazel. No abnormal vaginal discharge but is sexually active. No abdominal pain.   .. Active Ambulatory Problems    Diagnosis Date Noted  . 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 Pilonidal cyst 07/14/2017  . Attention deficit hyperactivity disorder (ADHD), combined type 07/14/2017  . No energy 07/14/2017  . Constipation 07/14/2017  . Current smoker 07/14/2017  . Low serum vitamin B12 07/15/2017  . Vitamin D insufficiency 07/15/2017  . Low iron stores 07/15/2017  . Depression, recurrent ([place]) 09/08/2017  . Anxiety 09/08/2017  . Stress reaction 09/08/2017  . Mid-back pain, acute 09/08/2017  . Lumbar degenerative disc disease 04/15/2018  . Cervical radiculopathy 09/01/2018  . Neck pain 09/01/2018  . Laceration of thumb, left 10/13/2018  . Abscess of right external cheek 10/27/2018   Resolved Ambulatory Problems    Diagnosis Date Noted  . No Resolved Ambulatory Problems   Past Medical History:  Diagnosis Date  . 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     Objective:   Physical Exam Vitals reviewed.  Pulmonary:     Effort: Pulmonary effort is normal.  Abdominal:     General: There is no distension.     Palpations: Abdomen is soft.     Tenderness: There is no abdominal tenderness. There is no right CVA tenderness, left CVA tenderness, guarding or rebound.  Genitourinary:   Neurological:     Mental Status: She is  alert.           Assessment & Plan:  Marland KitchenMarland KitchenCrystle was seen today for hemorrhoids.  Diagnoses and all orders for this visit:  Vaginal pain -     doxycycline (VIBRA-TABS) 100 MG tablet; Take 1 tablet (100 mg total) by mouth 2 (two) times daily. -     HYDROcodone-acetaminophen (NORCO/VICODIN) 5-325 MG tablet; Take 1 tablet by mouth every 6 (six) hours as needed for up to 5 days for moderate pain. Acute pain. -     C. trachomatis/N. gonorrhoeae RNA  Vaginal irritation -     C. trachomatis/N. gonorrhoeae RNA  Anal skin tag  Rectal pain -     doxycycline (VIBRA-TABS) 100 MG tablet; Take 1 tablet (100 mg total) by mouth 2 (two) times daily. -     HYDROcodone-acetaminophen (NORCO/VICODIN) 5-325 MG tablet; Take 1 tablet by mouth every 6 (six) hours as needed for up to 5 days for moderate pain. Acute pain.  Other orders -     Discontinue: HYDROcodone-acetaminophen (NORCO/VICODIN) 5-325 MG tablet; Take 1 tablet by mouth every 6 (six) hours as needed for up to 5 days for moderate pain. Acute pain.   Unclear etiology of pain. No thrombosed hemorrhoid seen to drain. Tenderness in the area of bartholin's duct. Perhaps a cyst could be forming there but anatomically not where I would expect it to be. Pain radiates from anus to vaginal area through perineum. Will do STD testing. No  signs of severe infection with fever, chills, body aches. Will start doxycycline, continue warm compresses, continue to use hemorrhoid cream on skin tags. If not improving or worsening call back. May need to consider CT scan to rule out infection tract or internal abscess of perineum.   ..PDMP reviewed during this encounter.  Small quantity of norco given for pain. Use sparingly.  Ibuprofen up to 800mg  up to three times a day for pain.   Spent 32 minutes with patient.

## 2019-07-27 NOTE — Progress Notes (Signed)
No STDs great news.

## 2019-08-01 ENCOUNTER — Other Ambulatory Visit: Payer: Self-pay | Admitting: Physician Assistant

## 2019-08-01 DIAGNOSIS — F902 Attention-deficit hyperactivity disorder, combined type: Secondary | ICD-10-CM

## 2019-08-02 ENCOUNTER — Other Ambulatory Visit: Payer: Self-pay | Admitting: Physician Assistant

## 2019-08-02 DIAGNOSIS — F902 Attention-deficit hyperactivity disorder, combined type: Secondary | ICD-10-CM

## 2019-08-02 NOTE — Telephone Encounter (Signed)
Patient requested medication too soon. Medication denied.

## 2019-08-03 ENCOUNTER — Other Ambulatory Visit: Payer: Self-pay | Admitting: Physician Assistant

## 2019-08-03 DIAGNOSIS — F902 Attention-deficit hyperactivity disorder, combined type: Secondary | ICD-10-CM

## 2019-08-03 DIAGNOSIS — F419 Anxiety disorder, unspecified: Secondary | ICD-10-CM

## 2019-08-03 DIAGNOSIS — F339 Major depressive disorder, recurrent, unspecified: Secondary | ICD-10-CM

## 2019-08-04 ENCOUNTER — Encounter: Payer: Self-pay | Admitting: Physician Assistant

## 2019-08-04 NOTE — Telephone Encounter (Signed)
Last RX sent 07/01/19  RX pended

## 2019-08-04 NOTE — Telephone Encounter (Signed)
Duplicate request

## 2019-08-05 MED ORDER — AMPHETAMINE-DEXTROAMPHETAMINE 15 MG PO TABS: 15.0000 mg | ORAL_TABLET | Freq: Two times a day (BID) | ORAL | 0 refills | Status: DC

## 2019-08-11 ENCOUNTER — Other Ambulatory Visit: Payer: Self-pay | Admitting: Physician Assistant

## 2019-08-11 DIAGNOSIS — Z1239 Encounter for other screening for malignant neoplasm of breast: Secondary | ICD-10-CM

## 2019-08-26 ENCOUNTER — Telehealth (INDEPENDENT_AMBULATORY_CARE_PROVIDER_SITE_OTHER): Payer: Federal, State, Local not specified - PPO | Admitting: Family Medicine

## 2019-08-26 ENCOUNTER — Other Ambulatory Visit: Payer: Self-pay

## 2019-08-26 ENCOUNTER — Encounter: Payer: Self-pay | Admitting: Family Medicine

## 2019-08-26 DIAGNOSIS — J029 Acute pharyngitis, unspecified: Secondary | ICD-10-CM | POA: Insufficient documentation

## 2019-08-26 MED ORDER — AMOXICILLIN 500 MG PO CAPS: 500.0000 mg | ORAL_CAPSULE | Freq: Two times a day (BID) | ORAL | 0 refills | Status: AC

## 2019-08-26 NOTE — Progress Notes (Signed)
Patient reports sore throat Right ear pain. Started yesterday. Not sure if its stress.  Denies any other symptoms. Right lymph node are swollen.

## 2019-08-26 NOTE — Assessment & Plan Note (Signed)
Centor score of 3 for tonsil swelling with exudate, absence of cough and tender cervical lymph nodes.  Unable to test here in office due to COVID-19 pandemic so will cover empirically with amoxicillin.  In addition to antibiotic I recommended she increase her fluid intake, try warm salt water gargles and tylenol/ibuprofen as needed.  She is instructed to call if symptoms are worsening or she develops new symptoms.

## 2019-08-26 NOTE — Progress Notes (Signed)
Patricia Potter - 34 y.o. female MRN [id number]  Date of birth: 10-Jun-1986   This visit type was conducted due to national recommendations for restrictions regarding the COVID-19 Pandemic (e.g. social distancing).  This format is felt to be most appropriate for this patient at this time.  All issues noted in this document were discussed and addressed.  No physical exam was performed (except for noted visual exam findings with Video Visits).  I discussed the limitations of evaluation and management by telemedicine and the availability of in person appointments. The patient expressed understanding and agreed to proceed.  I connected with@ on 08/26/19 at  4:00 PM EST by a video enabled telemedicine application and verified that I am speaking with the correct person using two identifiers.  Present at visit: Patricia Nutting, DO Charisse March   Patient Location: [place] [address]   Provider location:   [facility]  Chief Complaint  Patient presents with  . Sore Throat  . Ear Pain    right    HPI  Patricia Potter is a 34 y.o. female who presents via audio/video conferencing for a telehealth visit today.  She has complaint of sore throat with swollen tonsils with drainage/white spots on tonsils, swollen lymph nodes R>L.  It is painful to swallow but not difficult.  She denies congestion or nasal drainage.   She has not had fever, chills, fatigue, cough, or shortness of breath.  She has no known sick contacts.  She has not tried anything for treatment of this so far.    ROS:  A comprehensive ROS was completed and negative except as noted per HPI  Past Medical History:  Diagnosis Date  . 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  Thyroidectomy Left 2015    No family history on file.  Social History   Socioeconomic History  . Marital status: Legally Separated    Spouse name: Not on file  . Number of children: Not  on file  . Years of education: Not on file  . Highest education level: Not on file  Occupational History  . Not on file  Tobacco Use  . Smoking status: Unknown If Ever Smoked  . Smokeless tobacco: Never Used  Substance and Sexual Activity  . Alcohol use: Not on file  . Drug use: Not on file  . Sexual activity: Not on file  Other Topics Concern  . Not on file  Social History Narrative  . Not on file   Social Determinants of Health   Financial Resource Strain:   . Difficulty of Paying Living Expenses:   Food Insecurity:   . Worried About Charity fundraiser in the Last Year:   . Arboriculturist in the Last Year:   Transportation Needs:   . Film/video editor (Medical):   Marland Kitchen Lack of Transportation (Non-Medical):   Physical Activity:   . Days of Exercise per Week:   . Minutes of Exercise per Session:   Stress:   . Feeling of Stress :   Social Connections:   . Frequency of Communication with Friends and Family:   . Frequency of Social Gatherings with Friends and Family:   . Attends Religious Services:   . Active Member of Clubs or Organizations:   . Attends Archivist Meetings:   Marland Kitchen Marital Status:   Intimate Partner Violence:   . Fear of Current or Ex-Partner:   .  Emotionally Abused:   Marland Kitchen Physically Abused:   . Sexually Abused:      Current Outpatient Medications:  .  amphetamine-dextroamphetamine (ADDERALL) 15 MG tablet, Take 1 tablet by mouth 2 (two) times daily., Disp: 60 tablet, Rfl: 0 .  amphetamine-dextroamphetamine (ADDERALL) 15 MG tablet, Take 1 tablet by mouth 2 (two) times daily., Disp: 60 tablet, Rfl: 0 .  amphetamine-dextroamphetamine (ADDERALL) 15 MG tablet, Take 1 tablet by mouth 2 (two) times daily., Disp: 60 tablet, Rfl: 0 .  doxycycline (VIBRA-TABS) 100 MG tablet, Take 1 tablet (100 mg total) by mouth 2 (two) times daily., Disp: 20 tablet, Rfl: 0 .  escitalopram (LEXAPRO) 20 MG tablet, Take 1 tablet (20 mg total) by mouth daily., Disp: 90  tablet, Rfl: 0 .  hydrocortisone (ANUSOL-HC) 2.5 % rectal cream, Place 1 application rectally 2 (two) times daily., Disp: 30 g, Rfl: 0 .  methocarbamol (ROBAXIN) 500 MG tablet, Take 1 tablet (500 mg total) by mouth 3 (three) times daily., Disp: 90 tablet, Rfl: 1 .  naproxen (NAPROSYN) 500 MG tablet, TAKE 1 TABLET (500 MG TOTAL) BY MOUTH 2 (TWO) TIMES DAILY AS NEEDED FOR MODERATE PAIN., Disp: 60 tablet, Rfl: 2 .  ondansetron (ZOFRAN-ODT) 4 MG disintegrating tablet, Take 1 tablet (4 mg total) by mouth every 8 (eight) hours as needed for nausea or vomiting., Disp: 6 tablet, Rfl: 0  EXAM:  VITALS per patient if applicable: BP 99991111   Pulse (!) 105   Ht 5\' 2"  (1.575 m)   Wt 146 lb (66.2 kg)   SpO2 98%   BMI 26.70 kg/m   GENERAL: alert, oriented, appears well and in no acute distress  HEENT: atraumatic, conjunttiva clear, no obvious abnormalities on inspection of external nose and ears  NECK: normal movements of the head and neck  LUNGS: on inspection no signs of respiratory distress, breathing rate appears normal, no obvious gross SOB, gasping or wheezing  CV: no obvious cyanosis  MS: moves all visible extremities without noticeable abnormality  PSYCH/NEURO: pleasant and cooperative, no obvious depression or anxiety, speech and thought processing grossly intact  ASSESSMENT AND PLAN:  Discussed the following assessment and plan:  Pharyngitis Centor score of 3 for tonsil swelling with exudate, absence of cough and tender cervical lymph nodes.  Unable to test here in office due to COVID-19 pandemic so will cover empirically with amoxicillin.  In addition to antibiotic I recommended she increase her fluid intake, try warm salt water gargles and tylenol/ibuprofen as needed.  She is instructed to call if symptoms are worsening or she develops new symptoms.   Meds ordered this encounter  Medications  . amoxicillin (AMOXIL) 500 MG capsule    Sig: Take 1 capsule (500 mg total) by mouth  2 (two) times daily for 7 days.    Dispense:  14 capsule    Refill:  0     30 minutes spent including pre visit preparation, review of prior notes and labs, encounter with patient via video visit and same day documentation.

## 2019-08-29 ENCOUNTER — Other Ambulatory Visit: Payer: Self-pay | Admitting: Physician Assistant

## 2019-08-29 DIAGNOSIS — F902 Attention-deficit hyperactivity disorder, combined type: Secondary | ICD-10-CM

## 2019-08-29 MED ORDER — AMPHETAMINE-DEXTROAMPHETAMINE 15 MG PO TABS: 15.0000 mg | ORAL_TABLET | Freq: Two times a day (BID) | ORAL | 0 refills | Status: DC

## 2019-08-29 NOTE — Telephone Encounter (Signed)
..  PDMP reviewed during this encounter. Refilled adderall.

## 2019-09-06 ENCOUNTER — Ambulatory Visit (INDEPENDENT_AMBULATORY_CARE_PROVIDER_SITE_OTHER): Payer: Federal, State, Local not specified - PPO | Admitting: Physician Assistant

## 2019-09-06 DIAGNOSIS — Z5329 Procedure and treatment not carried out because of patient's decision for other reasons: Secondary | ICD-10-CM

## 2019-09-06 NOTE — Progress Notes (Signed)
No show

## 2019-09-07 DIAGNOSIS — M79642 Pain in left hand: Secondary | ICD-10-CM | POA: Diagnosis not present

## 2019-09-07 DIAGNOSIS — M25532 Pain in left wrist: Secondary | ICD-10-CM | POA: Diagnosis not present

## 2019-09-07 DIAGNOSIS — Z3202 Encounter for pregnancy test, result negative: Secondary | ICD-10-CM | POA: Diagnosis not present

## 2019-09-07 DIAGNOSIS — M25571 Pain in right ankle and joints of right foot: Secondary | ICD-10-CM | POA: Diagnosis not present

## 2019-09-07 DIAGNOSIS — M25512 Pain in left shoulder: Secondary | ICD-10-CM | POA: Diagnosis not present

## 2019-09-28 ENCOUNTER — Other Ambulatory Visit: Payer: Self-pay | Admitting: Physician Assistant

## 2019-09-28 DIAGNOSIS — F339 Major depressive disorder, recurrent, unspecified: Secondary | ICD-10-CM

## 2019-09-28 DIAGNOSIS — F419 Anxiety disorder, unspecified: Secondary | ICD-10-CM

## 2019-09-28 DIAGNOSIS — F902 Attention-deficit hyperactivity disorder, combined type: Secondary | ICD-10-CM

## 2019-09-29 ENCOUNTER — Telehealth: Payer: Self-pay | Admitting: Physician Assistant

## 2019-09-29 ENCOUNTER — Other Ambulatory Visit: Payer: Self-pay | Admitting: Physician Assistant

## 2019-09-29 ENCOUNTER — Other Ambulatory Visit: Payer: Self-pay

## 2019-09-29 DIAGNOSIS — F419 Anxiety disorder, unspecified: Secondary | ICD-10-CM

## 2019-09-29 DIAGNOSIS — F339 Major depressive disorder, recurrent, unspecified: Secondary | ICD-10-CM

## 2019-09-29 DIAGNOSIS — F902 Attention-deficit hyperactivity disorder, combined type: Secondary | ICD-10-CM

## 2019-09-29 MED ORDER — ESCITALOPRAM OXALATE 20 MG PO TABS: 20.0000 mg | ORAL_TABLET | Freq: Every day | ORAL | 0 refills | Status: DC

## 2019-09-29 NOTE — Telephone Encounter (Signed)
Duplicate med refill request. Unable to refuse refill request. Medical assistant denied security.

## 2019-09-29 NOTE — Telephone Encounter (Signed)
[facility] requesting med refill for amphetamine- dextro.

## 2019-09-29 NOTE — Telephone Encounter (Signed)
Duplicate med refill request from pharmacy. Unable to refuse med refill. Medical assistant denied security.

## 2019-09-29 NOTE — Telephone Encounter (Signed)
Received fax for PA on Amphetamine-Dextroamphetamine sent through cover my meds and received authorization.   Valid: 08/30/19 - 09/28/20 - CF

## 2019-09-30 MED ORDER — ESCITALOPRAM OXALATE 20 MG PO TABS: 20.0000 mg | ORAL_TABLET | Freq: Every day | ORAL | 0 refills | Status: DC

## 2019-09-30 NOTE — Telephone Encounter (Signed)
Attempted to call PT. Left VM to schedule appt.

## 2019-10-04 ENCOUNTER — Other Ambulatory Visit: Payer: Self-pay | Admitting: Physician Assistant

## 2019-10-04 DIAGNOSIS — F902 Attention-deficit hyperactivity disorder, combined type: Secondary | ICD-10-CM

## 2019-10-05 MED ORDER — AMPHETAMINE-DEXTROAMPHETAMINE 15 MG PO TABS: 15.0000 mg | ORAL_TABLET | Freq: Two times a day (BID) | ORAL | 0 refills | Status: DC

## 2019-10-10 ENCOUNTER — Encounter: Payer: Self-pay | Admitting: Nurse Practitioner

## 2019-10-10 ENCOUNTER — Ambulatory Visit (INDEPENDENT_AMBULATORY_CARE_PROVIDER_SITE_OTHER): Payer: Federal, State, Local not specified - PPO | Admitting: Nurse Practitioner

## 2019-10-10 ENCOUNTER — Other Ambulatory Visit: Payer: Self-pay

## 2019-10-10 VITALS — BP 120/67 | HR 99 | Ht 62.0 in | Wt 155.0 lb

## 2019-10-10 DIAGNOSIS — Z3201 Encounter for pregnancy test, result positive: Secondary | ICD-10-CM | POA: Diagnosis not present

## 2019-10-10 DIAGNOSIS — O210 Mild hyperemesis gravidarum: Secondary | ICD-10-CM | POA: Diagnosis not present

## 2019-10-10 DIAGNOSIS — Z3A01 Less than 8 weeks gestation of pregnancy: Secondary | ICD-10-CM

## 2019-10-10 DIAGNOSIS — C73 Malignant neoplasm of thyroid gland: Secondary | ICD-10-CM | POA: Diagnosis not present

## 2019-10-10 LAB — POCT URINE PREGNANCY: Preg Test, Ur: POSITIVE — AB

## 2019-10-10 MED ORDER — ONDANSETRON HCL 4 MG PO TABS: 4.0000 mg | ORAL_TABLET | Freq: Three times a day (TID) | ORAL | 1 refills | Status: DC | PRN

## 2019-10-10 NOTE — Patient Instructions (Addendum)
Estimated due date would be the end of December/beginning of January Begin taking a prenatal vitamin immediately    Pregnancy and Hypothyroidism Hypothyroidism is a condition that develops if your thyroid has low activity. The thyroid is a small, butterfly-shaped gland in your neck. It is located in front of your windpipe. It makes hormones that play an important role in regulating your breathing, heart rate, menstrual cycle, body temperature, and other bodily functions. If you have hypothyroidism, your thyroid gland does not produce enough thyroid hormones. When you are pregnant, your body uses more thyroid hormones. This can cause mild hypothyroidism to get worse. How does this affect me? Hypothyroidism during pregnancy can cause you to have:  Fatigue.  Abnormal weight gain. For women of normal weight, it is common to gain about 1 pound per week during pregnancy.  Difficulty having a bowel movement (constipation).  Feeling cold more often than others do.  Muscle aches.  Pregnancy complications, such as: ? High blood pressure that develops after the 20th week of pregnancy (preeclampsia). ? Pregnancy loss (miscarriage). ? Preterm birth. ? Placenta problems. How does this affect my baby? Hypothyroidism can also affect your baby. Babies need thyroid hormone from their mothers for normal growth and brain development. Babies born to mothers with hypothyroidism during pregnancy may:  Be born prematurely.  Have low birth weight.  Have mental delays.  May develop hypothyroidism. This is rare. What can I do to lower my risk? Some women with hypothyroidism need extra iodine during pregnancy. Your health care provider may recommend that you:  Eat foods with iodine, such as: ? Iodized salt. ? Pasteurized eggs and dairy products. ? Low-mercury seafood.  Take a prenatal vitamin that contains iodine.  Take iodine supplements. How is this treated? Treatment may  include:  Monitoring. If you have mild hypothyroidism, your health care provider will monitor your thyroid hormone levels closely to watch for any changes.  Medicines. Your health care provider may prescribe medicine to control your thyroid hormone levels. Follow these instructions at home:  Take over-the-counter and prescription medicines only as told by your health care provider. ? Check with your health care provider before taking any hypothyroid medicines that were prescribed before you became pregnant. Many are safe, but some treatments for hypothyroidism may have to be stopped during pregnancy.  You may be asked to perform kick counts to monitor your baby's movements. If your baby moves fewer than 10 times in 2 hours during a period when the baby is usually active (typically in the evening), you should see your health care provider right away.  Take a prenatal vitamin as told by your health care provider.  Keep all follow-up visits. This is important. Contact a health care provider if you:  Have new symptoms or your symptoms get worse.  Gain more than 5 lb (2.3 kg) in 1 week.  Have a lump in your neck.  Have a scratchy throat or difficulty speaking that lasts longer than a month and is not related to a cold.  Have a hard time swallowing. Get help right away if:  Your baby is less active than normal.  Your baby stops moving completely.  You develop muscle cramps.  You have pain in your abdomen.  You have heavy bleeding.  You develop a fever or chills.  You have a very bad headache or vision problems.  You develop swelling in your legs and ankles. Summary  Hypothyroidism is a condition that develops if your thyroid has low   activity.  Hypothyroidism during pregnancy can lead to complications for both you and your baby.  Take medicines, vitamins, and supplements as told by your health care provider during your pregnancy to control your condition.  Keep regular  prenatal appointments so your health care provider can closely monitor your condition during pregnancy. This information is not intended to replace advice given to you by your health care provider. Make sure you discuss any questions you have with your health care provider. Document Revised: 09/24/2018 Document Reviewed: 07/07/2017 Elsevier Patient Education  2020 [employer]   Morning Sickness  Morning sickness is when a woman feels nauseous during pregnancy. This nauseous feeling may or may not come with vomiting. It often occurs in the morning, but it can be a problem at any time of day. Morning sickness is most common during the first trimester. In some cases, it may continue throughout pregnancy. Although morning sickness is unpleasant, it is usually harmless unless the woman develops severe and continual vomiting (hyperemesis gravidarum), a condition that requires more intense treatment. What are the causes? The exact cause of this condition is not known, but it seems to be related to normal hormonal changes that occur in pregnancy. What increases the risk? You are more likely to develop this condition if:  You experienced nausea or vomiting before your pregnancy.  You had morning sickness during a previous pregnancy.  You are pregnant with more than one baby, such as twins. What are the signs or symptoms? Symptoms of this condition include:  Nausea.  Vomiting. How is this diagnosed? This condition is usually diagnosed based on your signs and symptoms. How is this treated? In many cases, treatment is not needed for this condition. Making some changes to what you eat may help to control symptoms. Your health care provider may also prescribe or recommend:  Vitamin B6 supplements.  Anti-nausea medicines.  Ginger. Follow these instructions at home: Medicines  Take over-the-counter and prescription medicines only as told by your health care provider. Do not use any  prescription, over-the-counter, or herbal medicines for morning sickness without first talking with your health care provider.  Taking multivitamins before getting pregnant can prevent or decrease the severity of morning sickness in most women. Eating and drinking  Eat a piece of dry toast or crackers before getting out of bed in the morning.  Eat 5 or 6 small meals a day.  Eat dry and bland foods, such as rice or a baked potato. Foods that are high in carbohydrates are often helpful.  Avoid greasy, fatty, and spicy foods.  Have someone cook for you if the smell of any food causes nausea and vomiting.  If you feel nauseous after taking prenatal vitamins, take the vitamins at night or with a snack.  Snack on protein foods between meals if you are hungry. Nuts, yogurt, and cheese are good options.  Drink fluids throughout the day.  Try ginger ale made with real ginger, ginger tea made from fresh grated ginger, or ginger candies. General instructions  Do not use any products that contain nicotine or tobacco, such as cigarettes and e-cigarettes. If you need help quitting, ask your health care provider.  Get an air purifier to keep the air in your house free of odors.  Get plenty of fresh air.  Try to avoid odors that trigger your nausea.  Consider trying these methods to help relieve symptoms: ? Wearing an acupressure wristband. These wristbands are often worn for seasickness. ? Acupuncture. Contact a   health care provider if:  Your home remedies are not working and you need medicine.  You feel dizzy or light-headed.  You are losing weight. Get help right away if:  You have persistent and uncontrolled nausea and vomiting.  You faint.  You have severe pain in your abdomen. Summary  Morning sickness is when a woman feels nauseous during pregnancy. This nauseous feeling may or may not come with vomiting.  Morning sickness is most common during the first trimester.  It  often occurs in the morning, but it can be a problem at any time of day.  In many cases, treatment is not needed for this condition. Making some changes to what you eat may help to control symptoms. This information is not intended to replace advice given to you by your health care provider. Make sure you discuss any questions you have with your health care provider. Document Revised: 05/15/2017 Document Reviewed: 07/05/2016 Elsevier Patient Education  [place].    Gestational Diabetes Mellitus, Self Care Caring for yourself after you have been diagnosed with gestational diabetes (gestational diabetes mellitus) means keeping your blood sugar (glucose) under control. You can do that with a balance of:  Nutrition.  Exercise.  Lifestyle changes.  Medicines or insulin, if necessary.  Support from your team of health care providers and others. The following information explains what you need to know to manage your gestational diabetes at home. What are the risks? If gestational diabetes is treated, it is unlikely to cause problems. If it is not controlled with treatment, it may cause problems during labor and delivery, and some of those problems can be harmful to the unborn baby (fetus) and the mother. Uncontrolled gestational diabetes may also cause the newborn baby to have breathing problems and low blood glucose. Women who get gestational diabetes are more likely to develop it if they get pregnant again, and they are more likely to develop type 2 diabetes in the future. How to monitor blood glucose   Check your blood glucose every day during your pregnancy. Do this as often as told by your health care provider.  Contact your health care provider if your blood glucose is above your target for two tests in a row. Your health care provider will set individualized treatment goals for you. Generally, the goal of treatment is to maintain the following blood glucose levels during  pregnancy:  Before meals (preprandial): at or below 95 mg/dL (5.3 mmol/L).  After meals (postprandial): ? One hour after a meal: at or below 140 mg/dL (7.8 mmol/L). ? Two hours after a meal: at or below 120 mg/dL (6.7 mmol/L).  A1c (hemoglobin A1c) level: 6-6.5%. How to manage hyperglycemia and hypoglycemia Hyperglycemia symptoms Hyperglycemia, also called high blood glucose, occurs when blood glucose is too high. Make sure you know the Arita Severtson signs of hyperglycemia, such as:  Increased thirst.  Hunger.  Feeling very tired.  Needing to urinate more often than usual.  Blurry vision. Hypoglycemia symptoms Hypoglycemia, also called low blood glucose, occurs with a blood glucose level at or below 70 mg/dL (3.9 mmol/L). The risk for hypoglycemia increases during or after exercise, during sleep, during illness, and when skipping meals or not eating for a long time (fasting). Symptoms may include:  Hunger.  Anxiety.  Sweating and feeling clammy.  Confusion.  Dizziness or feeling light-headed.  Sleepiness.  Nausea.  Increased heart rate.  Headache.  Blurry vision.  Irritability.  Tingling or numbness around the mouth, lips, or tongue.  A change in coordination.  Restless sleep.  Fainting.  Seizure. It is important to know the symptoms of hypoglycemia and treat it right away. Always have a 15-gram rapid-acting carbohydrate snack with you to treat low blood glucose. Family members and close friends should also know the symptoms and should understand how to treat hypoglycemia, in case you are not able to treat yourself. Treating hypoglycemia If you are alert and able to swallow safely, follow the 15:15 rule:  Take 15 grams of a rapid-acting carbohydrate. Talk with your health care provider about how much you should take.  Rapid-acting options include: ? Glucose pills (take 15 grams). ? 6-8 pieces of hard candy. ? 4-6 oz (120-150 mL) of fruit juice. ? 4-6 oz  (120-150 mL) of regular (not diet) soda. ? 1 Tbsp (15 mL) honey or sugar.  Check your blood glucose 15 minutes after you take the carbohydrate.  If the repeat blood glucose level is still at or below 70 mg/dL (3.9 mmol/L), take 15 grams of a carbohydrate again.  If your blood glucose level does not increase above 70 mg/dL (3.9 mmol/L) after 3 tries, seek emergency medical care.  After your blood glucose level returns to normal, eat a meal or a snack within 1 hour. Treating severe hypoglycemia Severe hypoglycemia is when your blood glucose level is at or below 54 mg/dL (3 mmol/L). Severe hypoglycemia is an emergency. Do not wait to see if the symptoms will go away. Get medical help right away. Call your local emergency services (911 in the [place] If you have severe hypoglycemia and you cannot eat or drink, you may need an injection of glucagon. A family member or close friend should learn how to check your blood glucose and how to give you a glucagon injection. Ask your health care provider if you need to have an emergency glucagon injection kit available. Severe hypoglycemia may need to be treated in a hospital. The treatment may include getting glucose through an IV. You may also need treatment for the cause of your hypoglycemia. Follow these instructions at home: Take diabetes medicines as told  If your health care provider prescribed insulin or diabetes medicines, take them every day.  Do not run out of insulin or other diabetes medicines that you take. Plan ahead so you always have these available.  If you use insulin, adjust your dosage based on how physically active you are and what foods you eat. Your health care provider will tell you how to adjust your dosage. Make healthy food choices  The things that you eat and drink affect your blood glucose (and your insulin dose, if this applies). Making good choices helps to control your diabetes and prevent other health problems. A healthy  meal plan includes eating lean proteins, complex carbohydrates, fresh fruits and vegetables, low-fat dairy products, and healthy fats. Make an appointment to see a diet and nutrition specialist (registered dietitian) to help you create an eating plan that is right for you. Make sure that you:  Follow instructions from your health care provider about eating or drinking restrictions.  Drink enough fluid to keep your urine pale yellow.  Eat healthy snacks between nutritious meals.  Keep a record of the carbohydrates that you eat. Do this by reading food labels and learning the standard serving sizes of foods.  Follow your sick day plan whenever you cannot eat or drink as usual. Make this plan in advance with your health care provider.  Stay active  Do 30  or more minutes of physical activity a day, or as much physical activity as your health care provider recommends during your pregnancy. ? Doing 10 minutes of exercise starting 30 minutes after each meal may help to control postprandial blood glucose levels.  If you start a new exercise or activity, work with your health care provider to adjust your insulin, medicines, or food intake as needed. Make healthy lifestyle choices  Do not drink alcohol.  Do not use any tobacco products, such as cigarettes, chewing tobacco, and e-cigarettes. If you need help quitting, ask your health care provider.  Learn to manage stress. If you need help with this, ask your health care provider. Care for your body  Keep your immunizations up to date.  Brush your teeth and gums two times a day, and floss one or more times a day. Visit your dentist one or more times every 6 months.  Maintain a healthy weight during your pregnancy. General instructions  Take over-the-counter and prescription medicines only as told by your health care provider.  Talk with your health care provider about your risk for high blood pressure during pregnancy (preeclampsia or  eclampsia).  Share your diabetes management plan with people in your workplace, school, and household.  Check your urine for ketones during your pregnancy when you are ill and as told by your health care provider.  Carry a medical alert card or wear medical alert jewelry that says you have gestational diabetes.  Keep all follow-up visits during your pregnancy (prenatal) and after delivery (postnatal) as told by your health care provider. This is important. Get the care that you need after delivery  Have your blood glucose level checked 4-12 weeks after delivery. This is done with an oral glucose tolerance test (OGTT).  Get screened for diabetes at least every 3 years, or as often as told by your health care provider. Questions to ask your health care provider  Do I need to meet with a diabetes educator?  Where can I find a support group for people with gestational diabetes? Where to find more information For more information about gestational diabetes, visit:  American Diabetes Association (ADA): www.diabetes.org  Centers for Disease Control and Prevention (CDC): http://www.wolf.info/ Summary  Check your blood glucose every day during your pregnancy. Do this as often as told by your health care provider.  If your health care provider prescribed insulin or diabetes medicines, take them every day as told.  Keep all follow-up visits during your pregnancy (prenatal) and after delivery (postnatal) as told by your health care provider. This is important.  Have your blood glucose level checked 4-12 weeks after delivery. This information is not intended to replace advice given to you by your health care provider. Make sure you discuss any questions you have with your health care provider. Document Revised: 11/23/2017 Document Reviewed: 07/06/2015 Elsevier Patient Education  2020 [employer].

## 2019-10-10 NOTE — Progress Notes (Signed)
Acute Office Visit  Subjective:    Patient ID: Patricia Potter, female    DOB: 06-05-1986, 34 y.o.   MRN: [id number]  Chief Complaint  Patient presents with  . Routine Prenatal Visit    HPI Patient is in today for pregnancy testing. Her LMP was 09/09/2019. She reports her period was late and she took 2 home pregnancy tests, both of which were positive. She is requesting pregnancy testing today and referral to obstetrics if results are positive.   She endorses Alesi Zachery morning nausea, breast tenderness, and lower abdominal cramping .  She denies vaginal bleeding, discharge, changes on bowel or bladder habits, chest pain, shortness of breath, or dizziness.   She has two biological children, both of which were born in [place], [place]. She has a history of gestational diabetes with both pregnancies. 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   She is a daily smoker, but plans to quit during pregnancy. She does take daily escitalopram and adderall. At this time she would like to continue these medications.   Past Medical History:  Diagnosis Date  . Papillary carcinoma ([place]) 2015    Past Surgical History:  Procedure Laterality Date  .  Thyroidectomy Left 2015    History reviewed. No pertinent family history.  Social History   Socioeconomic History  . Marital status: Legally Separated    Spouse name: Not on file  . Number of children: Not on file  . Years of education: Not on file  . Highest education level: Not on file  Occupational History  . Not on file  Tobacco Use  . Smoking status: Unknown If Ever Smoked  . Smokeless tobacco: Never Used  Substance and Sexual Activity  . Alcohol use: Not on file  . Drug use: Not on file  . Sexual activity: Not on file  Other Topics Concern  . Not on file  Social History Narrative  . Not on file   Social Determinants of Health   Financial Resource Strain:   .  Difficulty of Paying Living Expenses:   Food Insecurity:   . Worried About Charity fundraiser in the Last Year:   . Arboriculturist in the Last Year:   Transportation Needs:   . Film/video editor (Medical):   Marland Kitchen Lack of Transportation (Non-Medical):   Physical Activity:   . Days of Exercise per Week:   . Minutes of Exercise per Session:   Stress:   . Feeling of Stress :   Social Connections:   . Frequency of Communication with Friends and Family:   . Frequency of Social Gatherings with Friends and Family:   . Attends Religious Services:   . Active Member of Clubs or Organizations:   . Attends Archivist Meetings:   Marland Kitchen Marital Status:   Intimate Partner Violence:   . Fear of Current or Ex-Partner:   . Emotionally Abused:   Marland Kitchen Physically Abused:   . Sexually Abused:     Outpatient Medications Prior to Visit  Medication Sig Dispense Refill  . amphetamine-dextroamphetamine (ADDERALL) 15 MG tablet Take 1 tablet by mouth 2 (two) times daily. 60 tablet 0  . amphetamine-dextroamphetamine (ADDERALL) 15 MG tablet Take 1 tablet by mouth 2 (two) times daily. 60 tablet 0  . [START ON 11/04/2019] amphetamine-dextroamphetamine (ADDERALL) 15 MG tablet Take 1 tablet by mouth 2 (two) times daily. 60 tablet 0  . escitalopram (LEXAPRO) 20  MG tablet Take 1 tablet (20 mg total) by mouth daily. 90 tablet 0  . escitalopram (LEXAPRO) 20 MG tablet Take 1 tablet (20 mg total) by mouth daily. 90 tablet 0  . amphetamine-dextroamphetamine (ADDERALL) 15 MG tablet Take 1 tablet by mouth 2 (two) times daily. 60 tablet 0  . doxycycline (VIBRA-TABS) 100 MG tablet Take 1 tablet (100 mg total) by mouth 2 (two) times daily. 20 tablet 0  . hydrocortisone (ANUSOL-HC) 2.5 % rectal cream Place 1 application rectally 2 (two) times daily. 30 g 0  . methocarbamol (ROBAXIN) 500 MG tablet Take 1 tablet (500 mg total) by mouth 3 (three) times daily. 90 tablet 1  . naproxen (NAPROSYN) 500 MG tablet TAKE 1 TABLET  (500 MG TOTAL) BY MOUTH 2 (TWO) TIMES DAILY AS NEEDED FOR MODERATE PAIN. 60 tablet 2  . ondansetron (ZOFRAN-ODT) 4 MG disintegrating tablet Take 1 tablet (4 mg total) by mouth every 8 (eight) hours as needed for nausea or vomiting. 6 tablet 0   No facility-administered medications prior to visit.    No Known Allergies  Review of Systems  Constitutional: Positive for fatigue. Negative for appetite change, fever and unexpected weight change.  Respiratory: Negative for cough, chest tightness and shortness of breath.   Cardiovascular: Negative for chest pain, palpitations and leg swelling.  Gastrointestinal: Positive for nausea. Negative for abdominal distention, abdominal pain, constipation, diarrhea and vomiting.  Endocrine: Negative for cold intolerance and heat intolerance.  Genitourinary: Negative for dysuria, flank pain, frequency, pelvic pain, vaginal bleeding, vaginal discharge and vaginal pain.  Musculoskeletal: Negative for back pain and myalgias.  Neurological: Negative for dizziness, weakness, light-headedness and headaches.  Psychiatric/Behavioral: Negative for confusion, decreased concentration and sleep disturbance. The patient is not nervous/anxious.        Objective:    Physical Exam Constitutional:      Appearance: Normal appearance. She is normal weight.  HENT:     Head: Normocephalic and atraumatic.  Eyes:     Extraocular Movements: Extraocular movements intact.     Conjunctiva/sclera: Conjunctivae normal.     Pupils: Pupils are equal, round, and reactive to light.  Cardiovascular:     Rate and Rhythm: Normal rate and regular rhythm.     Pulses: Normal pulses.     Heart sounds: Normal heart sounds.  Pulmonary:     Effort: Pulmonary effort is normal.     Breath sounds: Normal breath sounds.  Abdominal:     General: Abdomen is flat. Bowel sounds are normal. There is no distension.     Palpations: Abdomen is soft.     Tenderness: There is no abdominal  tenderness.  Musculoskeletal:        General: Normal range of motion.     Right lower leg: No edema.     Left lower leg: No edema.  Skin:    General: Skin is warm and dry.     Capillary Refill: Capillary refill takes less than 2 seconds.  Neurological:     General: No focal deficit present.     Mental Status: She is alert and oriented to person, place, and time.  Psychiatric:        Mood and Affect: Mood normal.        Behavior: Behavior normal.        Thought Content: Thought content normal.        Judgment: Judgment normal.     BP 120/67   Pulse 99   Ht 5\' 2"  (1.575 m)  Wt 155 lb (70.3 kg)   SpO2 100%   BMI 28.35 kg/m  Wt Readings from Last 3 Encounters:  10/10/19 155 lb (70.3 kg)  08/26/19 146 lb (66.2 kg)  07/26/19 146 lb (66.2 kg)    Health Maintenance Due  Topic Date Due  . HIV Screening  Never done  . PAP SMEAR-Modifier  Never done    There are no preventive care reminders to display for this patient.   Lab Results  Component Value Date   TSH 1.57 07/14/2017   Lab Results  Component Value Date   WBC 10.1 07/14/2017   HGB 13.7 07/14/2017   HCT 40.6 07/14/2017   MCV 83.5 07/14/2017   PLT 297 07/14/2017   Lab Results  Component Value Date   NA 140 07/14/2017   K 4.6 07/14/2017   CO2 24 07/14/2017   GLUCOSE 95 07/14/2017   BUN 17 07/14/2017   CREATININE 0.69 07/14/2017   BILITOT 0.3 07/14/2017   AST 14 07/14/2017   ALT 7 07/14/2017   PROT 7.3 07/14/2017   CALCIUM 9.6 07/14/2017   Lab Results  Component Value Date   CHOL 182 07/14/2017   Lab Results  Component Value Date   HDL 46 (L) 07/14/2017   Lab Results  Component Value Date   LDLCALC 115 (H) 07/14/2017   Lab Results  Component Value Date   TRIG 104 07/14/2017   Lab Results  Component Value Date   CHOLHDL 4.0 07/14/2017   No results found for: HGBA1C     Assessment & Plan:   1. Positive urine pregnancy test Encounter for positive pregnancy test at home.   Point-of-care pregnancy test in the office today positive.  Will obtain quantitative hCG, CBC, and CMP today.  Patient will be notified of results.  LMP 09/09/2019. Naegele's rule reveals estimated due date of June 17, 2020. Ambulatory referral to obstetrics and gynecology placed. Patient provided with handout on over-the-counter medications that are safe for use during pregnancy.  Patient encouraged to begin prenatal vitamin today.  Patient encouraged to stop smoking and to not consume alcohol.  Information provided to the patient on pregnancy and current and past medical conditions. We did discuss the current medications the patient is taking including escitalopram and Adderall.  At this time joint decision was made to continue these medications but she was encouraged to discuss this with her obstetrician. - Ambulatory referral to Obstetrics / Gynecology - B-HCG Quant - CBC with Differential/Platelet - COMPLETE METABOLIC PANEL WITH GFR  2. Less than [redacted] weeks gestation of pregnancy Positive urine pregnancy test in office today.  LMP 09/09/2019.  - POCT urine pregnancy - Ambulatory referral to Obstetrics / Gynecology - B-HCG Quant - CBC with Differential/Platelet - COMPLETE METABOLIC PANEL WITH GFR  3. Papillary thyroid carcinoma ([facility]) Positive history for papillary thyroid carcinoma with surgical removal of part of her thyroid gland.  Given the new state of pregnancy we will monitor TSH and free T4 today. Will notify patient of results. - TSH + free T4  4. Morning sickness Morning sickness associated with pregnancy.   Instructions provided to try eating small amounts of food such as crackers when nausea occurs.  Prescription for Zofran 4 mg provided today for use for nausea not relieved by food intake. - ondansetron (ZOFRAN) 4 MG tablet; Take 1 tablet (4 mg total) by mouth every 8 (eight) hours as needed for nausea or vomiting.  Dispense: 90 tablet; Refill: 1   Orma Render, NP

## 2019-10-11 ENCOUNTER — Other Ambulatory Visit: Payer: Self-pay | Admitting: Nurse Practitioner

## 2019-10-11 DIAGNOSIS — Z3201 Encounter for pregnancy test, result positive: Secondary | ICD-10-CM

## 2019-10-11 LAB — CBC WITH DIFFERENTIAL/PLATELET
Absolute Monocytes: 571 cells/uL (ref 200–950)
Basophils Absolute: 61 cells/uL (ref 0–200)
Basophils Relative: 0.6 %
Eosinophils Absolute: 214 cells/uL (ref 15–500)
Eosinophils Relative: 2.1 %
HCT: 40.2 % (ref 35.0–45.0)
Hemoglobin: 13.2 g/dL (ref 11.7–15.5)
Lymphs Abs: 3580 cells/uL (ref 850–3900)
MCH: 29.5 pg (ref 27.0–33.0)
MCHC: 32.8 g/dL (ref 32.0–36.0)
MCV: 89.9 fL (ref 80.0–100.0)
MPV: 10.8 fL (ref 7.5–12.5)
Monocytes Relative: 5.6 %
Neutro Abs: 5773 cells/uL (ref 1500–7800)
Neutrophils Relative %: 56.6 %
Platelets: 319 10*3/uL (ref 140–400)
RBC: 4.47 10*6/uL (ref 3.80–5.10)
RDW: 13.8 % (ref 11.0–15.0)
Total Lymphocyte: 35.1 %
WBC: 10.2 10*3/uL (ref 3.8–10.8)

## 2019-10-11 LAB — COMPLETE METABOLIC PANEL WITH GFR
AG Ratio: 1.8 (calc) (ref 1.0–2.5)
ALT: 10 U/L (ref 6–29)
AST: 14 U/L (ref 10–30)
Albumin: 4.5 g/dL (ref 3.6–5.1)
Alkaline phosphatase (APISO): 56 U/L (ref 31–125)
BUN: 14 mg/dL (ref 7–25)
CO2: 23 mmol/L (ref 20–32)
Calcium: 9.2 mg/dL (ref 8.6–10.2)
Chloride: 102 mmol/L (ref 98–110)
Creat: 0.56 mg/dL (ref 0.50–1.10)
GFR, Est African American: 143 mL/min/{1.73_m2} (ref >=60)
GFR, Est Non African American: 123 mL/min/{1.73_m2} (ref >=60)
Globulin: 2.5 g/dL (calc) (ref 1.9–3.7)
Glucose, Bld: 80 mg/dL (ref 65–139)
Potassium: 4.2 mmol/L (ref 3.5–5.3)
Sodium: 136 mmol/L (ref 135–146)
Total Bilirubin: 0.3 mg/dL (ref 0.2–1.2)
Total Protein: 7 g/dL (ref 6.1–8.1)

## 2019-10-11 LAB — HCG, QUANTITATIVE, PREGNANCY: HCG, Total, QN: 58 m[IU]/mL

## 2019-10-11 LAB — TSH+FREE T4: TSH W/REFLEX TO FT4: 2.17 mIU/L

## 2019-10-12 ENCOUNTER — Encounter: Payer: Self-pay | Admitting: Nurse Practitioner

## 2019-10-13 ENCOUNTER — Other Ambulatory Visit: Payer: Self-pay | Admitting: Nurse Practitioner

## 2019-10-18 DIAGNOSIS — M545 Low back pain: Secondary | ICD-10-CM | POA: Diagnosis not present

## 2019-10-18 DIAGNOSIS — Z3A01 Less than 8 weeks gestation of pregnancy: Secondary | ICD-10-CM | POA: Diagnosis not present

## 2019-10-18 DIAGNOSIS — O209 Hemorrhage in early pregnancy, unspecified: Secondary | ICD-10-CM | POA: Diagnosis not present

## 2019-10-18 DIAGNOSIS — F329 Major depressive disorder, single episode, unspecified: Secondary | ICD-10-CM | POA: Diagnosis not present

## 2019-10-18 DIAGNOSIS — Z79899 Other long term (current) drug therapy: Secondary | ICD-10-CM | POA: Diagnosis not present

## 2019-10-18 DIAGNOSIS — F17210 Nicotine dependence, cigarettes, uncomplicated: Secondary | ICD-10-CM | POA: Diagnosis not present

## 2019-10-18 DIAGNOSIS — O26891 Other specified pregnancy related conditions, first trimester: Secondary | ICD-10-CM | POA: Diagnosis not present

## 2019-10-18 DIAGNOSIS — F909 Attention-deficit hyperactivity disorder, unspecified type: Secondary | ICD-10-CM | POA: Diagnosis not present

## 2019-10-18 DIAGNOSIS — O3481 Maternal care for other abnormalities of pelvic organs, first trimester: Secondary | ICD-10-CM | POA: Diagnosis not present

## 2019-10-18 DIAGNOSIS — N83201 Unspecified ovarian cyst, right side: Secondary | ICD-10-CM | POA: Diagnosis not present

## 2019-10-18 DIAGNOSIS — R000 Tachycardia, unspecified: Secondary | ICD-10-CM | POA: Diagnosis not present

## 2019-10-18 DIAGNOSIS — R1032 Left lower quadrant pain: Secondary | ICD-10-CM | POA: Diagnosis not present

## 2019-10-18 DIAGNOSIS — Z91041 Radiographic dye allergy status: Secondary | ICD-10-CM | POA: Diagnosis not present

## 2019-10-18 DIAGNOSIS — R42 Dizziness and giddiness: Secondary | ICD-10-CM | POA: Diagnosis not present

## 2019-10-19 ENCOUNTER — Encounter: Payer: Self-pay | Admitting: Nurse Practitioner

## 2019-10-20 ENCOUNTER — Other Ambulatory Visit: Payer: Self-pay

## 2019-10-20 ENCOUNTER — Encounter: Payer: Self-pay | Admitting: Nurse Practitioner

## 2019-10-20 ENCOUNTER — Ambulatory Visit (INDEPENDENT_AMBULATORY_CARE_PROVIDER_SITE_OTHER): Payer: Federal, State, Local not specified - PPO

## 2019-10-20 ENCOUNTER — Ambulatory Visit (INDEPENDENT_AMBULATORY_CARE_PROVIDER_SITE_OTHER): Payer: Federal, State, Local not specified - PPO | Admitting: Nurse Practitioner

## 2019-10-20 VITALS — BP 131/76 | HR 92 | Ht 62.0 in | Wt 160.0 lb

## 2019-10-20 DIAGNOSIS — Z331 Pregnant state, incidental: Secondary | ICD-10-CM | POA: Diagnosis not present

## 2019-10-20 DIAGNOSIS — C73 Malignant neoplasm of thyroid gland: Secondary | ICD-10-CM | POA: Diagnosis not present

## 2019-10-20 DIAGNOSIS — O209 Hemorrhage in early pregnancy, unspecified: Secondary | ICD-10-CM

## 2019-10-20 DIAGNOSIS — E041 Nontoxic single thyroid nodule: Secondary | ICD-10-CM | POA: Diagnosis not present

## 2019-10-20 NOTE — Telephone Encounter (Signed)
Patient evaluated in office 

## 2019-10-20 NOTE — Progress Notes (Signed)
Acute Office Visit  Subjective:    Patient ID: Patricia Potter, female    DOB: [birth year], 34 y.o.   MRN: [id number]  No chief complaint on file.   HPI Patient is in today for evaluation of abnormal uterine bleeding in the [facility] pregnancy. She was seen on 10/11/2019 with a positive urine pregnancy test. At that time serum HCG was positive, but lower than expected. On 10/18/2019 she began to experience bright red vaginal bleeding and went to the emergency room where her HCG was higher, but still not as high as expected. A transvaginal ultrasound was unable to detect the gestational sac, but this was not abnormal for the weeks of gestation according to the HCG. Today she presents with continued bright red vaginal bleeding with large clots. She is also experiencing lower back pain and abdominal cramping.   Past Medical History:  Diagnosis Date  . Papillary carcinoma ([place]) 2015    Past Surgical History:  Procedure Laterality Date  .  Thyroidectomy Left 2015    No family history on file.  Social History   Socioeconomic History  . Marital status: Legally Separated    Spouse name: Not on file  . Number of children: Not on file  . Years of education: Not on file  . Highest education level: Not on file  Occupational History  . Not on file  Tobacco Use  . Smoking status: Unknown If Ever Smoked  . Smokeless tobacco: Never Used  Substance and Sexual Activity  . Alcohol use: Not on file  . Drug use: Not on file  . Sexual activity: Not on file  Other Topics Concern  . Not on file  Social History Narrative  . Not on file   Social Determinants of Health   Financial Resource Strain:   . Difficulty of Paying Living Expenses:   Food Insecurity:   . Worried About Charity fundraiser in the Last Year:   . Arboriculturist in the Last Year:   Transportation Needs:   . Film/video editor (Medical):   Marland Kitchen Lack of Transportation (Non-Medical):   Physical Activity:   . Days of Exercise  per Week:   . Minutes of Exercise per Session:   Stress:   . Feeling of Stress :   Social Connections:   . Frequency of Communication with Friends and Family:   . Frequency of Social Gatherings with Friends and Family:   . Attends Religious Services:   . Active Member of Clubs or Organizations:   . Attends Archivist Meetings:   Marland Kitchen Marital Status:   Intimate Partner Violence:   . Fear of Current or Ex-Partner:   . Emotionally Abused:   Marland Kitchen Physically Abused:   . Sexually Abused:     Outpatient Medications Prior to Visit  Medication Sig Dispense Refill  . amphetamine-dextroamphetamine (ADDERALL) 15 MG tablet Take 1 tablet by mouth 2 (two) times daily. 60 tablet 0  . amphetamine-dextroamphetamine (ADDERALL) 15 MG tablet Take 1 tablet by mouth 2 (two) times daily. 60 tablet 0  . [START ON 11/04/2019] amphetamine-dextroamphetamine (ADDERALL) 15 MG tablet Take 1 tablet by mouth 2 (two) times daily. 60 tablet 0  . escitalopram (LEXAPRO) 20 MG tablet Take 1 tablet (20 mg total) by mouth daily. 90 tablet 0  . escitalopram (LEXAPRO) 20 MG tablet Take 1 tablet (20 mg total) by mouth daily. 90 tablet 0  . ondansetron (ZOFRAN) 4 MG tablet Take 1 tablet (4 mg total) by mouth  every 8 (eight) hours as needed for nausea or vomiting. 90 tablet 1   No facility-administered medications prior to visit.    No Known Allergies  Review of Systems  Constitutional: Negative for activity change, appetite change, chills, fatigue and fever.  Respiratory: Negative for cough, chest tightness and shortness of breath.   Cardiovascular: Negative for chest pain, palpitations and leg swelling.  Gastrointestinal: Positive for abdominal pain. Negative for abdominal distention, constipation, diarrhea, nausea and vomiting.  Genitourinary: Positive for pelvic pain and vaginal bleeding. Negative for dysuria, frequency and hematuria.  Musculoskeletal: Positive for back pain.  Neurological: Negative for dizziness,  syncope, weakness and headaches.  Psychiatric/Behavioral: Positive for dysphoric mood. Negative for agitation, confusion, self-injury, sleep disturbance and suicidal ideas. The patient is nervous/anxious.        Objective:    Physical Exam Vitals and nursing note reviewed.  Constitutional:      Appearance: Normal appearance. She is normal weight.  HENT:     Head: Normocephalic and atraumatic.  Eyes:     Extraocular Movements: Extraocular movements intact.     Conjunctiva/sclera: Conjunctivae normal.     Pupils: Pupils are equal, round, and reactive to light.  Cardiovascular:     Rate and Rhythm: Normal rate and regular rhythm.     Pulses: Normal pulses.     Heart sounds: Normal heart sounds.  Pulmonary:     Effort: Pulmonary effort is normal.     Breath sounds: Normal breath sounds.  Abdominal:     General: Abdomen is flat. Bowel sounds are normal. There is no distension.     Palpations: Abdomen is soft.  Musculoskeletal:        General: Normal range of motion.  Skin:    General: Skin is warm and dry.     Capillary Refill: Capillary refill takes less than 2 seconds.  Neurological:     General: No focal deficit present.     Mental Status: She is alert and oriented to person, place, and time.  Psychiatric:        Mood and Affect: Mood normal.        Behavior: Behavior normal.        Thought Content: Thought content normal.        Judgment: Judgment normal.     There were no vitals taken for this visit. Wt Readings from Last 3 Encounters:  10/10/19 155 lb (70.3 kg)  08/26/19 146 lb (66.2 kg)  07/26/19 146 lb (66.2 kg)    Health Maintenance Due  Topic Date Due  . HIV Screening  Never done  . PAP SMEAR-Modifier  Never done    There are no preventive care reminders to display for this patient.   Lab Results  Component Value Date   TSH 1.57 07/14/2017   Lab Results  Component Value Date   WBC 10.2 10/10/2019   HGB 13.2 10/10/2019   HCT 40.2 10/10/2019    MCV 89.9 10/10/2019   PLT 319 10/10/2019   Lab Results  Component Value Date   NA 136 10/10/2019   K 4.2 10/10/2019   CO2 23 10/10/2019   GLUCOSE 80 10/10/2019   BUN 14 10/10/2019   CREATININE 0.56 10/10/2019   BILITOT 0.3 10/10/2019   AST 14 10/10/2019   ALT 10 10/10/2019   PROT 7.0 10/10/2019   CALCIUM 9.2 10/10/2019   Lab Results  Component Value Date   CHOL 182 07/14/2017   Lab Results  Component Value Date   HDL 46 (  L) 07/14/2017   Lab Results  Component Value Date   LDLCALC 115 (H) 07/14/2017   Lab Results  Component Value Date   TRIG 104 07/14/2017   Lab Results  Component Value Date   CHOLHDL 4.0 07/14/2017   No results found for: HGBA1C     Assessment & Plan:   1. Vaginal bleeding in pregnancy, first trimester Presentation with bright red vaginal bleeding in the first trimester of pregnancy. Due to the volume of blood described by the patient and the passage of clots, it is possible that the pregnancy is no longer viable. We will obtain HCG levels today to compare to the hospital labs from two days ago to determine if the levels are still increasing or decreasing.  Instructed the patient on emergency symptoms that would warrant evaluation including severe abdominal pain, fever, unable to tolerate liquids, or large volumes of bleeding.  Will follow-up based on HCG results.  - B-HCG Quant  3. Papillary thyroid carcinoma [facility]) Patient requesting repeat thyroid ultrasound. Last ultrasound revealed cyst on the right lobe, which is the only area that remains after surgery for thyroid cancer. Ultrasound ordered. Will follow-up with patient based on results.  - US THYROID; Future   Orma Render, NP

## 2019-10-21 ENCOUNTER — Encounter: Payer: Self-pay | Admitting: Nurse Practitioner

## 2019-10-21 ENCOUNTER — Telehealth: Payer: Self-pay | Admitting: *Deleted

## 2019-10-21 LAB — HCG, QUANTITATIVE, PREGNANCY: HCG, Total, QN: 194 m[IU]/mL

## 2019-10-21 NOTE — Telephone Encounter (Signed)
Provider Julianne Handler, Midwife, advised patient to repeat HCG at [facility] on Saturday with evaluation.

## 2019-10-22 ENCOUNTER — Inpatient Hospital Stay (HOSPITAL_COMMUNITY): Payer: Federal, State, Local not specified - PPO

## 2019-10-22 ENCOUNTER — Encounter (HOSPITAL_COMMUNITY): Payer: Self-pay | Admitting: Obstetrics & Gynecology

## 2019-10-22 ENCOUNTER — Inpatient Hospital Stay (HOSPITAL_COMMUNITY)
Admission: AD | Admit: 2019-10-22 | Discharge: 2019-10-22 | Disposition: A | Discharge status: Home / Self Care | Payer: Federal, State, Local not specified - PPO | Attending: Obstetrics & Gynecology | Admitting: Obstetrics & Gynecology

## 2019-10-22 ENCOUNTER — Other Ambulatory Visit: Payer: Self-pay

## 2019-10-22 DIAGNOSIS — O210 Mild hyperemesis gravidarum: Secondary | ICD-10-CM | POA: Insufficient documentation

## 2019-10-22 DIAGNOSIS — O039 Complete or unspecified spontaneous abortion without complication: Secondary | ICD-10-CM | POA: Insufficient documentation

## 2019-10-22 DIAGNOSIS — Z3A01 Less than 8 weeks gestation of pregnancy: Secondary | ICD-10-CM | POA: Insufficient documentation

## 2019-10-22 DIAGNOSIS — Z3A00 Weeks of gestation of pregnancy not specified: Secondary | ICD-10-CM | POA: Diagnosis not present

## 2019-10-22 DIAGNOSIS — O209 Hemorrhage in early pregnancy, unspecified: Secondary | ICD-10-CM | POA: Diagnosis not present

## 2019-10-22 LAB — CBC
HCT: 38.4 % (ref 36.0–46.0)
Hemoglobin: 12.8 g/dL (ref 12.0–15.0)
MCH: 29.8 pg (ref 26.0–34.0)
MCHC: 33.3 g/dL (ref 30.0–36.0)
MCV: 89.3 fL (ref 80.0–100.0)
Platelets: 360 10*3/uL (ref 150–400)
RBC: 4.3 MIL/uL (ref 3.87–5.11)
RDW: 14.1 % (ref 11.5–15.5)
WBC: 10.8 10*3/uL — ABNORMAL HIGH (ref 4.0–10.5)
nRBC: 0 % (ref 0.0–0.2)

## 2019-10-22 LAB — ABO/RH: ABO/RH(D): AB POS

## 2019-10-22 LAB — HCG, QUANTITATIVE, PREGNANCY: hCG, Beta Chain, Quant, S: 51 m[IU]/mL — ABNORMAL HIGH (ref <5)

## 2019-10-22 MED ORDER — ONDANSETRON HCL 4 MG PO TABS: 8.0000 mg | ORAL_TABLET | Freq: Three times a day (TID) | ORAL | 0 refills | Status: DC | PRN

## 2019-10-22 MED ORDER — ACETAMINOPHEN-CODEINE #3 300-30 MG PO TABS: 1.0000 | ORAL_TABLET | Freq: Four times a day (QID) | ORAL | 0 refills | Status: DC | PRN

## 2019-10-22 MED ORDER — MISOPROSTOL 200 MCG PO TABS: 800.0000 ug | ORAL_TABLET | Freq: Once | ORAL | 0 refills | Status: DC

## 2019-10-22 NOTE — Discharge Instructions (Signed)
Managing Pregnancy Loss Pregnancy loss can happen any time during a pregnancy. Often the cause is not known. It is rarely because of anything you did. Pregnancy loss in early pregnancy (during the first trimester) is called a miscarriage. This type of pregnancy loss is the most common. Pregnancy loss that happens after 20 weeks of pregnancy is called fetal demise if the baby's heart stops beating before birth. Fetal demise is much less common. Some women experience spontaneous labor shortly after fetal demise resulting in a stillborn birth (stillbirth). Any pregnancy loss can be devastating. You will need to recover both physically and emotionally. Most women are able to get pregnant again after a pregnancy loss and deliver a healthy baby. How to manage emotional recovery  Pregnancy loss is very hard emotionally. You may feel many different emotions while you grieve. You may feel sad and angry. You may also feel guilty. It is normal to have periods of crying. Emotional recovery can take longer than physical recovery. It is different for everyone. Taking these steps can help you in managing this loss:  Remember that it is unlikely you did anything to cause the pregnancy loss.  Share your thoughts and feelings with friends, family, and your partner. Remember that your partner is also recovering emotionally.  Make sure you have a good support system. Do not spend too much time alone.  Meet with a pregnancy loss counselor or join a pregnancy loss support group.  Get enough sleep and eat a healthy diet. Return to regular exercise when you have recovered physically.  Do not use drugs or alcohol to manage your emotions.  Consider seeing a mental health professional to help you recover emotionally.  Ask a friend or loved one to help you decide what to do with any clothing and nursery items you received for your baby. In the case of a stillbirth, many women benefit from taking additional steps in the  grieving process. You may want to:  Hold your baby after the birth.  Name your baby.  Request a birth certificate.  Create a keepsake such as handprints or footprints.  Dress your baby and have a picture taken.  Make funeral arrangements.  Ask for a baptism or blessing. Hospitals have staff members who can help you with all these arrangements. How to recognize emotional stress It is normal to have emotional stress after a pregnancy loss. But emotional stress that lasts a long time or becomes severe requires treatment. Watch out for these signs of severe emotional stress:  Sadness, anger, or guilt that is not going away and is interfering with your normal activities.  Relationship problems that have occurred or gotten worse since the pregnancy loss.  Signs of depression that last longer than 2 weeks. These may include: ? Sadness. ? Anxiety. ? Hopelessness. ? Loss of interest in activities you enjoy. ? Inability to concentrate. ? Trouble sleeping or sleeping too much. ? Loss of appetite or overeating. ? Thoughts of death or of hurting yourself. Follow these instructions at home:  Take over-the-counter and prescription medicines only as told by your health care provider.  Rest at home until your energy level returns. Return to your normal activities as told by your health care provider. Ask your health care provider what activities are safe for you.  When you are ready, meet with your health care provider to discuss steps to take for a future pregnancy.  Keep all follow-up visits as told by your health care provider. This is important.  Where to find support  To help you and your partner with the process of grieving, talk with your health care provider or seek counseling.  Consider meeting with others who have experienced pregnancy loss. Ask your health care provider about support groups and resources. Where to find more information  U.S. Department of Health and Holiday representative on Women's Health: [place]  American Pregnancy Association: www.americanpregnancy.org Contact a health care provider if:  You continue to experience grief, sadness, or lack of motivation for everyday activities, and those feelings do not improve over time.  You are struggling to recover emotionally, especially if you are using alcohol or substances to help. Get help right away if:  You have thoughts of hurting yourself or others. If you ever feel like you may hurt yourself or others, or have thoughts about taking your own life, get help right away. You can go to your nearest emergency department or call:  Your local emergency services (911 in the [place]  A suicide crisis helpline, such as the [place] at 647-419-7713. This is open 24 hours a day. Summary  Any pregnancy loss can be difficult physically and emotionally.  You may experience many different emotions while you grieve. Emotional recovery can last longer than physical recovery.  It is normal to have emotional stress after a pregnancy loss. But emotional stress that lasts a long time or becomes severe requires treatment.  See your health care provider if you are struggling emotionally after a pregnancy loss. This information is not intended to replace advice given to you by your health care provider. Make sure you discuss any questions you have with your health care provider. Document Revised: 09/22/2018 Document Reviewed: 08/13/2017 Elsevier Patient Education  2020 [employer] A miscarriage is the loss of an unborn baby (fetus) before the 20th week of pregnancy. Follow these instructions at home: Medicines   Take over-the-counter and prescription medicines only as told by your doctor.  If you were prescribed antibiotic medicine, take it as told by your doctor. Do not stop taking the antibiotic even if you start to feel better.  Do not take NSAIDs  unless your doctor says that this is safe for you. NSAIDs include aspirin and ibuprofen. These medicines can cause bleeding. Activity  Rest as directed. Ask your doctor what activities are safe for you.  Have someone help you at home during this time. General instructions  Write down how many pads you use each day and how soaked they are.  Watch the amount of tissue or clumps of blood (blood clots) that you pass from your vagina. Save any large amounts of tissue for your doctor.  Do not use tampons, douche, or have sex until your doctor approves.  To help you and your partner with the process of grieving, talk with your doctor or seek counseling.  When you are ready, meet with your doctor to talk about steps you should take for your health. Also, talk with your doctor about steps to take to have a healthy pregnancy in the future.  Keep all follow-up visits as told by your doctor. This is important. Contact a doctor if:  You have a fever or chills.  You have vaginal discharge that smells bad.  You have more bleeding. Get help right away if:  You have very bad cramps or pain in your back or belly.  You pass clumps of blood that are walnut-sized or larger from your vagina.  You pass tissue  that is walnut-sized or larger from your vagina.  You soak more than 1 regular pad in an hour.  You get light-headed or weak.  You faint (pass out).  You have feelings of sadness that do not go away, or you have thoughts of hurting yourself. Summary  A miscarriage is the loss of an unborn baby before the 20th week of pregnancy.  Follow your doctor's instructions for home care. Keep all follow-up appointments.  To help you and your partner with the process of grieving, talk with your doctor or seek counseling. This information is not intended to replace advice given to you by your health care provider. Make sure you discuss any questions you have with your health care provider. Document  Revised: 09/24/2018 Document Reviewed: 07/08/2016 Elsevier Patient Education  [place].

## 2019-10-22 NOTE — MAU Provider Note (Addendum)
Patient Patricia Potter is a 34 y.o. G1P0 at [redacted]w[redacted]d here for a follow up BHCG.  She denies nausea, vomiting, dysuria, other abnormal discharge. She found out she was pregnant on 10/09/2019. She had a positive home pregnancy test.   She went to [place] [facility] for a pregnancy test and a blood test ("just to confirm"). This was on 10/10/2019; her quant that day was 33. Patient's understanding was that she was to return 2 days later for follow-up quant.   Patient then started bleeding heavily between the 4/26 and 5/4; she went to the [place] sent with her bleeding concerns on 5/4. Quant on 5/4 was 181; US showed no evidence of IUP on 05/4.   She was then seen on 10/20/2019 by her NP; BHCG was 194 and she was told to follow up in MAU in 48 hours.   Today, her symptoms are that she is still bleeding; she feels like she is still having small contractions. She says she passed "flesh colored tissue last night". This morning she passed a clot the size of her fist.    History     CSN: [id number]  Arrival date and time: 10/22/19 1338   None     Chief Complaint  Patient presents with  . Vaginal Bleeding   Vaginal Bleeding The patient's primary symptoms include vaginal bleeding. This is a new problem. The current episode started in the past 7 days. The problem occurs intermittently. The problem has been gradually improving. Pertinent negatives include no back pain, constipation, urgency or vomiting. The vaginal discharge was bloody. The vaginal bleeding is typical of menses. She has been passing clots. She has been passing tissue.    OB History    Gravida  3   Para  2   Term  2   Preterm      AB      Living  2     SAB      TAB      Ectopic      Multiple      Live Births  2           Past Medical History:  Diagnosis Date  . 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   Thyroidectomy Left 2015    No family history on file.  Social History   Tobacco Use  . Smoking status: Unknown If Ever Smoked  . Smokeless tobacco: Never Used  Substance Use Topics  . Alcohol use: Not on file  . Drug use: Not on file    Allergies:  Allergies  Allergen Reactions  . Iodine     No medications prior to admission.    Review of Systems  HENT: Negative.   Respiratory: Negative.   Cardiovascular: Negative.   Gastrointestinal: Negative for constipation and vomiting.  Genitourinary: Positive for vaginal bleeding. Negative for urgency.  Musculoskeletal: Negative for back pain.  Neurological: Negative.   Psychiatric/Behavioral: Negative.    Physical Exam   Blood pressure 118/78, pulse 72, temperature 99.2 F (37.3 C), temperature source Oral, resp. rate 16, height 5\' 2"  (1.575 m), weight 72.2 kg, last menstrual period 09/09/2019, SpO2 99 %.  Physical Exam  Constitutional: She is oriented to person, place, and time. She appears well-developed.  HENT:  Head: Normocephalic.  Respiratory: Effort normal.  GI: Soft.  Genitourinary:    Genitourinary Comments: NEFG; dark red blood in the vagina,  no clots, no lesions. Cervix is FT, long, no CMT, no suprapubic or adnexal tenderness.    Musculoskeletal:        General: Normal range of motion.     Cervical back: Normal range of motion.  Neurological: She is alert and oriented to person, place, and time.  Skin: Skin is warm and dry.    MAU Course  Procedures  MDM -quant today is 51, significant drop from two days ago (194 on 10/20/19 to 51 on 10/22/19) -CBC is 14; AB blood type. Other cultures not performed as patient has no complaint of vaginal pain, dysuria or abnormal discharge.  - -US shows nothing in the uterus, however, patient's quant has significant drop. She would like to be sent home with cytotec for missed ab as she is teary and would like to finish the process.   I have independently reviewed the [place]  images, which reveals nothing in the uterus and adnexa without masses. Technically pregnancy of unknown location.     Assessment and Plan   1. Miscarriage   2. Morning sickness   -Message sent to [facility] to have patient start ob care and to follow up beta HCG as patient technically has pregnancy of unknown location, although drop in quant is reassuring for miscarriage.  -Cytotec instructions given; patient prefers to do at home. Work note given for patient to be out until Friday.  -Explained to patient when she should return (saturating more than two pads in an hour for two hours), worsening abdominal pain, she may take Tylenol # 3 and zofran for vomiting and pain (RX given).  -All questions answered; patient stable for discharge.   Mervyn Skeeters Dewan Emond 10/22/2019, 5:44 PM

## 2019-10-22 NOTE — MAU Note (Signed)
Patricia Potter is a 34 y.o. at [redacted]w[redacted]d here in MAU reporting: here for repeat hcg and u/s. States she is having pain and bleeding. Bleeding is more than a normal period for her, is wearing a pad and change them every 2 hours, states they are saturated.   LMP: 09/09/19  Onset of complaint: ongoing  Pain score: 2/10  Vitals:   10/22/19 1407  BP: 122/64  Pulse: 76  Resp: 16  Temp: 99.2 F (37.3 C)  SpO2: 99%     Lab orders placed from triage: hcg

## 2019-10-24 ENCOUNTER — Encounter: Payer: Self-pay | Admitting: Nurse Practitioner

## 2019-10-28 ENCOUNTER — Telehealth: Payer: Self-pay | Admitting: *Deleted

## 2019-10-28 ENCOUNTER — Other Ambulatory Visit: Payer: Federal, State, Local not specified - PPO | Admitting: *Deleted

## 2019-10-28 NOTE — Progress Notes (Addendum)
Pt N/S for repeat BHCG

## 2019-10-28 NOTE — Addendum Note (Signed)
Addended by: Asencion Islam on: 10/28/2019 09:25 AM   Modules accepted: Orders

## 2019-10-28 NOTE — Telephone Encounter (Signed)
Left patient a message about missed lab appointment on 10/28/19 at 9:00 AM.

## 2019-11-03 ENCOUNTER — Other Ambulatory Visit: Payer: Self-pay | Admitting: Physician Assistant

## 2019-11-03 DIAGNOSIS — F902 Attention-deficit hyperactivity disorder, combined type: Secondary | ICD-10-CM

## 2019-11-04 NOTE — Telephone Encounter (Signed)
Last filled 06/22/2019 #60 no RF

## 2019-11-07 ENCOUNTER — Encounter: Payer: Self-pay | Admitting: Nurse Practitioner

## 2019-11-07 MED ORDER — AMPHETAMINE-DEXTROAMPHETAMINE 15 MG PO TABS: 15.0000 mg | ORAL_TABLET | Freq: Two times a day (BID) | ORAL | 0 refills | Status: DC

## 2019-11-08 NOTE — Telephone Encounter (Signed)
Appointment has been made

## 2019-11-10 ENCOUNTER — Encounter: Payer: Self-pay | Admitting: Nurse Practitioner

## 2019-11-10 ENCOUNTER — Ambulatory Visit (INDEPENDENT_AMBULATORY_CARE_PROVIDER_SITE_OTHER): Payer: Federal, State, Local not specified - PPO | Admitting: Nurse Practitioner

## 2019-11-10 ENCOUNTER — Other Ambulatory Visit: Payer: Self-pay

## 2019-11-10 VITALS — BP 120/81 | HR 95 | Temp 98.2°F | Ht 62.0 in | Wt 162.9 lb

## 2019-11-10 DIAGNOSIS — R635 Abnormal weight gain: Secondary | ICD-10-CM

## 2019-11-10 DIAGNOSIS — F339 Major depressive disorder, recurrent, unspecified: Secondary | ICD-10-CM

## 2019-11-10 DIAGNOSIS — F419 Anxiety disorder, unspecified: Secondary | ICD-10-CM | POA: Diagnosis not present

## 2019-11-10 MED ORDER — BUSPIRONE HCL 5 MG PO TABS: 5.0000 mg | ORAL_TABLET | Freq: Three times a day (TID) | ORAL | 3 refills | Status: DC

## 2019-11-10 MED ORDER — ALPRAZOLAM 0.25 MG PO TABS: 0.2500 mg | ORAL_TABLET | Freq: Two times a day (BID) | ORAL | 2 refills | Status: DC | PRN

## 2019-11-10 MED ORDER — ESCITALOPRAM OXALATE 20 MG PO TABS: 30.0000 mg | ORAL_TABLET | Freq: Every day | ORAL | 3 refills | Status: DC

## 2019-11-10 NOTE — Progress Notes (Signed)
Acute Office Visit  Subjective:    Patient ID: Patricia Potter, female    DOB: 16-Aug-1985, 34 y.o.   MRN: [id number]  Chief Complaint  Patient presents with  . Depression  . Anxiety  . Weight Gain    HPI Patient is in today for increased depression and anxiety symptoms after recent miscarriage.  She also feels like this has triggered her to gain additional weight.  She reports periods of increased sadness and crying spells.  She also reports feelings of panic, palpitations, and racing heart.   She has been under a significant mental stress the past month with the miscarriage, a move, and her significant other leaving for [employer] duty.  She has been on 20 mg of escitalopram for some time now which she feels has been effective up until recently.  She reports that her mother has had success with daily BuSpar for her anxiety and she is interested in trying this.  She would also like to have something available for immediate relief of acute panic attacks as these have been fairly debilitating for her.  Her mother did have adverse reactions to bupropion and she would like to avoid trying this medication.  Past Medical History:  Diagnosis Date  . 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  Thyroidectomy Left 2015    History reviewed. No pertinent family history.  Social History   Socioeconomic History  . Marital status: Legally Separated    Spouse name: Not on file  . Number of children: Not on file  . Years of education: Not on file  . Highest education level: Not on file  Occupational History  . Not on file  Tobacco Use  . Smoking status: Unknown If Ever Smoked  . Smokeless tobacco: Never Used  Substance and Sexual Activity  . Alcohol use: Not on file  . Drug use: Not on file  . Sexual activity: Not on file  Other Topics Concern  . Not on file  Social History Narrative  . Not on file   Social Determinants of Health    Financial Resource Strain:   . Difficulty of Paying Living Expenses:   Food Insecurity:   . Worried About Charity fundraiser in the Last Year:   . Arboriculturist in the Last Year:   Transportation Needs:   . Film/video editor (Medical):   Marland Kitchen Lack of Transportation (Non-Medical):   Physical Activity:   . Days of Exercise per Week:   . Minutes of Exercise per Session:   Stress:   . Feeling of Stress :   Social Connections:   . Frequency of Communication with Friends and Family:   . Frequency of Social Gatherings with Friends and Family:   . Attends Religious Services:   . Active Member of Clubs or Organizations:   . Attends Archivist Meetings:   Marland Kitchen Marital Status:   Intimate Partner Violence:   . Fear of Current or Ex-Partner:   . Emotionally Abused:   Marland Kitchen Physically Abused:   . Sexually Abused:     Outpatient Medications Prior to Visit  Medication Sig Dispense Refill  . amphetamine-dextroamphetamine (ADDERALL) 15 MG tablet Take 1 tablet by mouth 2 (two) times daily. 60 tablet 0  . escitalopram (LEXAPRO) 20 MG tablet Take 1 tablet (20 mg total) by mouth daily. 90 tablet 0  . acetaminophen-codeine (TYLENOL #3) 300-30 MG tablet Take 1-2 tablets by mouth every  6 (six) hours as needed for moderate pain. (Patient not taking: Reported on 11/10/2019) 15 tablet 0  . misoprostol (CYTOTEC) 200 MCG tablet Take 4 tablets (800 mcg total) by mouth once for 1 dose. Two tablets in cheek and vagina or 4 tablets in cheek 4 tablet 0  . ondansetron (ZOFRAN) 4 MG tablet Take 2 tablets (8 mg total) by mouth every 8 (eight) hours as needed for nausea or vomiting. (Patient not taking: Reported on 11/10/2019) 20 tablet 0   No facility-administered medications prior to visit.    Allergies  Allergen Reactions  . Iodine     Review of Systems  Constitutional: Positive for activity change, appetite change, fatigue and unexpected weight change.  Respiratory: Negative for cough, chest  tightness and shortness of breath.   Cardiovascular: Negative for chest pain, palpitations and leg swelling.  Gastrointestinal: Negative for abdominal pain, constipation, diarrhea, nausea and vomiting.  Endocrine: Negative for cold intolerance and heat intolerance.  Genitourinary: Negative for pelvic pain.  Neurological: Negative for tremors, seizures, weakness, light-headedness and headaches.  Psychiatric/Behavioral: Positive for agitation, decreased concentration, dysphoric mood and sleep disturbance. Negative for suicidal ideas. The patient is nervous/anxious.        Objective:    Physical Exam Vitals and nursing note reviewed.  Constitutional:      Appearance: Normal appearance.  HENT:     Head: Normocephalic.  Eyes:     Extraocular Movements: Extraocular movements intact.     Conjunctiva/sclera: Conjunctivae normal.     Pupils: Pupils are equal, round, and reactive to light.  Cardiovascular:     Rate and Rhythm: Normal rate and regular rhythm.     Pulses: Normal pulses.     Heart sounds: Normal heart sounds.  Pulmonary:     Effort: Pulmonary effort is normal.     Breath sounds: Normal breath sounds.  Abdominal:     General: Abdomen is flat. Bowel sounds are normal.     Palpations: Abdomen is soft.  Musculoskeletal:        General: Normal range of motion.     Cervical back: Normal range of motion.  Skin:    General: Skin is warm and dry.     Capillary Refill: Capillary refill takes less than 2 seconds.  Neurological:     General: No focal deficit present.     Mental Status: She is alert and oriented to person, place, and time.  Psychiatric:        Attention and Perception: Attention normal.        Mood and Affect: Mood is depressed.        Behavior: Behavior normal.        Thought Content: Thought content normal.        Judgment: Judgment normal.    BP 120/81   Pulse 95   Temp 98.2 F (36.8 C) (Oral)   Ht 5\' 2"  (1.575 m)   Wt 162 lb 14.4 oz (73.9 kg)   LMP  09/09/2019   SpO2 99%   Breastfeeding Unknown   BMI 29.79 kg/m  Wt Readings from Last 3 Encounters:  11/10/19 162 lb 14.4 oz (73.9 kg)  10/22/19 159 lb 3.2 oz (72.2 kg)  10/20/19 160 lb (72.6 kg)    There are no preventive care reminders to display for this patient.  There are no preventive care reminders to display for this patient.   Lab Results  Component Value Date   TSH 1.57 07/14/2017   Lab Results  Component Value  Date   WBC 10.8 (H) 10/22/2019   HGB 12.8 10/22/2019   HCT 38.4 10/22/2019   MCV 89.3 10/22/2019   PLT 360 10/22/2019   Lab Results  Component Value Date   NA 136 10/10/2019   K 4.2 10/10/2019   CO2 23 10/10/2019   GLUCOSE 80 10/10/2019   BUN 14 10/10/2019   CREATININE 0.56 10/10/2019   BILITOT 0.3 10/10/2019   AST 14 10/10/2019   ALT 10 10/10/2019   PROT 7.0 10/10/2019   CALCIUM 9.2 10/10/2019   Lab Results  Component Value Date   CHOL 182 07/14/2017   Lab Results  Component Value Date   HDL 46 (L) 07/14/2017   Lab Results  Component Value Date   LDLCALC 115 (H) 07/14/2017   Lab Results  Component Value Date   TRIG 104 07/14/2017   Lab Results  Component Value Date   CHOLHDL 4.0 07/14/2017   No results found for: HGBA1C     Assessment & Plan:   1. Depression, recurrent ([place]) Symptoms and presentation consistent with worsening depression related to recent loss of pregnancy and other life events.  Patient educated on the normalcy of her emotions after such a sudden loss.  Plan: -Increase escitalopram to 30 mg by mouth per day and add buspirone 5 mg 3 times a day to help with symptoms. Patient instructed to contact the office if symptoms worsen or fail to improve. - busPIRone (BUSPAR) 5 MG tablet; Take 1 tablet (5 mg total) by mouth 3 (three) times daily.  Dispense: 270 tablet; Refill: 3 - escitalopram (LEXAPRO) 20 MG tablet; Take 1.5 tablets (30 mg total) by mouth daily.  Dispense: 135 tablet; Refill: 3  2. Anxiety Anxiety  with panic states brought on by recent pregnancy loss and stressful changes with in her life. Plan: -Increase escitalopram to 30 mg by mouth per day. -Add buspirone 5 mg by mouth 3 times daily. -As needed alprazolam 0.25 mg up to 2 times per day as needed for panic states.  Patient educated on the importance of limiting the use of benzodiazepines. - busPIRone (BUSPAR) 5 MG tablet; Take 1 tablet (5 mg total) by mouth 3 (three) times daily.  Dispense: 270 tablet; Refill: 3 - escitalopram (LEXAPRO) 20 MG tablet; Take 1.5 tablets (30 mg total) by mouth daily.  Dispense: 135 tablet; Refill: 3 - ALPRAZolam (XANAX) 0.25 MG tablet; Take 1 tablet (0.25 mg total) by mouth 2 (two) times daily as needed for anxiety.  Dispense: 30 tablet; Refill: 2  3. Weight gain Weight gain associated with increased depression, anxiety and recent pregnancy.  Courage the patient to can continue to monitor her diet and begin exercising again as tolerated.  Patient reminded that at this time her hormones are not completely normalized and to give herself a little more time.  I do feel that once her depression and anxiety are better under control and she is able to work out regularly as she has in the past that this will resolve itself. Plan to follow-up if she continues to gain weight or if weight loss goals are not achieved with diet and exercise.  Return if symptoms worsen or fail to improve.  Orma Render, NP

## 2019-11-10 NOTE — Patient Instructions (Signed)
Managing Anxiety, Adult After being diagnosed with an anxiety disorder, you may be relieved to know why you have felt or behaved a certain way. You may also feel overwhelmed about the treatment ahead and what it will mean for your life. With care and support, you can manage this condition and recover from it. How to manage lifestyle changes Managing stress and anxiety  Stress is your body's reaction to life changes and events, both good and bad. Most stress will last just a few hours, but stress can be ongoing and can lead to more than just stress. Although stress can play a major role in anxiety, it is not the same as anxiety. Stress is usually caused by something external, such as a deadline, test, or competition. Stress normally passes after the triggering event has ended.  Anxiety is caused by something internal, such as imagining a terrible outcome or worrying that something will go wrong that will devastate you. Anxiety often does not go away even after the triggering event is over, and it can become long-term (chronic) worry. It is important to understand the differences between stress and anxiety and to manage your stress effectively so that it does not lead to an anxious response. Talk with your health care provider or a counselor to learn more about reducing anxiety and stress. He or she may suggest tension reduction techniques, such as:  Music therapy. This can include creating or listening to music that you enjoy and that inspires you.  Mindfulness-based meditation. This involves being aware of your normal breaths while not trying to control your breathing. It can be done while sitting or walking.  Centering prayer. This involves focusing on a word, phrase, or sacred image that means something to you and brings you peace.  Deep breathing. To do this, expand your stomach and inhale slowly through your nose. Hold your breath for 3-5 seconds. Then exhale slowly, letting your stomach muscles  relax.  Self-talk. This involves identifying thought patterns that lead to anxiety reactions and changing those patterns.  Muscle relaxation. This involves tensing muscles and then relaxing them. Choose a tension reduction technique that suits your lifestyle and personality. These techniques take time and practice. Set aside 5-15 minutes a day to do them. Therapists can offer counseling and training in these techniques. The training to help with anxiety may be covered by some insurance plans. Other things you can do to manage stress and anxiety include:  Keeping a stress/anxiety diary. This can help you learn what triggers your reaction and then learn ways to manage your response.  Thinking about how you react to certain situations. You may not be able to control everything, but you can control your response.  Making time for activities that help you relax and not feeling guilty about spending your time in this way.  Visual imagery and yoga can help you stay calm and relax.  Medicines Medicines can help ease symptoms. Medicines for anxiety include:  Anti-anxiety drugs.  Antidepressants. Medicines are often used as a primary treatment for anxiety disorder. Medicines will be prescribed by a health care provider. When used together, medicines, psychotherapy, and tension reduction techniques may be the most effective treatment. Relationships Relationships can play a big part in helping you recover. Try to spend more time connecting with trusted friends and family members. Consider going to couples counseling, taking family education classes, or going to family therapy. Therapy can help you and others better understand your condition. How to recognize changes in your   anxiety Everyone responds differently to treatment for anxiety. Recovery from anxiety happens when symptoms decrease and stop interfering with your daily activities at home or work. This may mean that you will start to:  Have  better concentration and focus. Worry will interfere less in your daily thinking.  Sleep better.  Be less irritable.  Have more energy.  Have improved memory. It is important to recognize when your condition is getting worse. Contact your health care provider if your symptoms interfere with home or work and you feel like your condition is not improving. Follow these instructions at home: Activity  Exercise. Most adults should do the following: ? Exercise for at least 150 minutes each week. The exercise should increase your heart rate and make you sweat (moderate-intensity exercise). ? Strengthening exercises at least twice a week.  Get the right amount and quality of sleep. Most adults need 7-9 hours of sleep each night. Lifestyle   Eat a healthy diet that includes plenty of vegetables, fruits, whole grains, low-fat dairy products, and lean protein. Do not eat a lot of foods that are high in solid fats, added sugars, or salt.  Make choices that simplify your life.  Do not use any products that contain nicotine or tobacco, such as cigarettes, e-cigarettes, and chewing tobacco. If you need help quitting, ask your health care provider.  Avoid caffeine, alcohol, and certain over-the-counter cold medicines. These may make you feel worse. Ask your pharmacist which medicines to avoid. General instructions  Take over-the-counter and prescription medicines only as told by your health care provider.  Keep all follow-up visits as told by your health care provider. This is important. Where to find support You can get help and support from these sources:  Self-help groups.  Online and community organizations.  A trusted spiritual leader.  Couples counseling.  Family education classes.  Family therapy. Where to find more information You may find that joining a support group helps you deal with your anxiety. The following sources can help you locate counselors or support groups near  you:  Mental Health America: www.mentalhealthamerica.net  Anxiety and Depression Association of America (ADAA): www.adaa.org  National Alliance on Mental Illness (NAMI): www.nami.org Contact a health care provider if you:  Have a hard time staying focused or finishing daily tasks.  Spend many hours a day feeling worried about everyday life.  Become exhausted by worry.  Start to have headaches, feel tense, or have nausea.  Urinate more than normal.  Have diarrhea. Get help right away if you have:  A racing heart and shortness of breath.  Thoughts of hurting yourself or others. If you ever feel like you may hurt yourself or others, or have thoughts about taking your own life, get help right away. You can go to your nearest emergency department or call:  Your local emergency services (911 in the [place]  A suicide crisis helpline, such as the National Suicide Prevention Lifeline at 1-800-273-8255. This is open 24 hours a day. Summary  Taking steps to learn and use tension reduction techniques can help calm you and help prevent triggering an anxiety reaction.  When used together, medicines, psychotherapy, and tension reduction techniques may be the most effective treatment.  Family, friends, and partners can play a big part in helping you recover from an anxiety disorder. This information is not intended to replace advice given to you by your health care provider. Make sure you discuss any questions you have with your health care provider. Document Revised:   11/02/2018 Document Reviewed: 11/02/2018  Panic Attack A panic attack is a sudden episode of severe anxiety, fear, or discomfort that causes physical and emotional symptoms. The attack may be in response to something frightening, or it may occur for no known reason. Symptoms of a panic attack can be similar to symptoms of a heart attack or stroke. It is important to see your health care provider when you have a panic attack so  that these conditions can be ruled out. A panic attack is a symptom of another condition. Most panic attacks go away with treatment of the underlying problem. If you have panic attacks often, you may have a condition called panic disorder. What are the causes? A panic attack may be caused by:  An extreme, life-threatening situation, such as a war or natural disaster.  An anxiety disorder, such as post-traumatic stress disorder.  Depression.  Certain medical conditions, including heart problems, neurological conditions, and infections.  Certain over-the-counter and prescription medicines.  Illegal drugs that increase heart rate and blood pressure, such as methamphetamine.  Alcohol.  Supplements that increase anxiety.  Panic disorder. What increases the risk? You are more likely to develop this condition if:  You have an anxiety disorder.  You have another mental health condition.  You take certain medicines.  You use alcohol, illegal drugs, or other substances.  You are under extreme stress.  A life event is causing increased feelings of anxiety and depression. What are the signs or symptoms? A panic attack starts suddenly, usually lasts about 20 minutes, and occurs with one or more of the following:  A pounding heart.  A feeling that your heart is beating irregularly or faster than normal (palpitations).  Sweating.  Trembling or shaking.  Shortness of breath or feeling smothered.  Feeling choked.  Chest pain or discomfort.  Nausea or a strange feeling in your stomach.  Dizziness, feeling lightheaded, or feeling like you might faint.  Chills or hot flashes.  Numbness or tingling in your lips, hands, or feet.  Feeling confused, or feeling that you are not yourself.  Fear of losing control or being emotionally unstable.  Fear of dying. How is this diagnosed? A panic attack is diagnosed with an assessment by your health care provider. During the  assessment your health care provider will ask questions about:  Your history of anxiety, depression, and panic attacks.  Your medical history.  Whether you drink alcohol, use illegal drugs, take supplements, or take medicines. Be honest about your substance use. Your health care provider may also:  Order blood tests or other kinds of tests to rule out serious medical conditions.  Refer you to a mental health professional for further evaluation. How is this treated? Treatment depends on the cause of the panic attack:  If the cause is a medical problem, your health care provider will either treat that problem or refer you to a specialist.  If the cause is emotional, you may be given anti-anxiety medicines or referred to a counselor. These medicines may reduce how often attacks happen, reduce how severe the attacks are, and lower anxiety.  If the cause is a medicine, your health care provider may tell you to stop the medicine, change your dose, or take a different medicine.  If the cause is a drug, treatment may involve letting the drug wear off and taking medicine to help the drug leave your body or to counteract its effects. Attacks caused by drug abuse may continue even if you stop  using the drug. Follow these instructions at home:  Take over-the-counter and prescription medicines only as told by your health care provider.  If you feel anxious, limit your caffeine intake.  Take good care of your physical and mental health by: ? Eating a balanced diet that includes plenty of fresh fruits and vegetables, whole grains, lean meats, and low-fat dairy. ? Getting plenty of rest. Try to get 7-8 hours of uninterrupted sleep each night. ? Exercising regularly. Try to get 30 minutes of physical activity at least 5 days a week. ? Not smoking. Talk to your health care provider if you need help quitting. ? Limiting alcohol intake to no more than 1 drink a day for nonpregnant women and 2 drinks a  day for men. One drink equals 12 oz of beer, 5 oz of wine, or 1 oz of hard liquor.  Keep all follow-up visits as told by your health care provider. This is important. Panic attacks may have underlying physical or emotional problems that take time to accurately diagnose. Contact a health care provider if:  Your symptoms do not improve, or they get worse.  You are not able to take your medicine as prescribed because of side effects. Get help right away if:  You have serious thoughts about hurting yourself or others.  You have symptoms of a panic attack. Do not drive yourself to the hospital. Have someone else drive you or call an ambulance. If you ever feel like you may hurt yourself or others, or you have thoughts about taking your own life, get help right away. You can go to your nearest emergency department or call:  Your local emergency services (911 in the [place]  A suicide crisis helpline, such as the [facility] at 623-134-6285. This is open 24 hours a day. Summary  A panic attack is a sign of a serious health or mental health condition. Get help right away. Do not drive yourself to the hospital. Have someone else drive you or call an ambulance.  Always see a health care provider to have the reasons for the panic attack correctly diagnosed.  If your panic attack was caused by a physical problem, follow your health care provider's suggestions for medicine, referral to a specialist, and lifestyle changes.  If your panic attack was caused by an emotional problem, follow through with counseling from a qualified mental health specialist.  If you feel like you may hurt yourself or others, call 911 and get help right away. This information is not intended to replace advice given to you by your health care provider. Make sure you discuss any questions you have with your health care provider. Document Revised: 05/15/2017 Document Reviewed: 07/11/2016 Elsevier  Patient Education  2020 [place] Patient Education  [employer].

## 2019-12-07 ENCOUNTER — Other Ambulatory Visit: Payer: Self-pay | Admitting: Physician Assistant

## 2019-12-07 DIAGNOSIS — F902 Attention-deficit hyperactivity disorder, combined type: Secondary | ICD-10-CM

## 2019-12-08 NOTE — Telephone Encounter (Signed)
Mediation refill request Last refill 11/07/19 Last OV- 11/10/19

## 2019-12-09 MED ORDER — AMPHETAMINE-DEXTROAMPHETAMINE 15 MG PO TABS: 15.0000 mg | ORAL_TABLET | Freq: Two times a day (BID) | ORAL | 0 refills | Status: DC

## 2020-01-06 ENCOUNTER — Other Ambulatory Visit: Payer: Self-pay | Admitting: Physician Assistant

## 2020-01-06 DIAGNOSIS — F902 Attention-deficit hyperactivity disorder, combined type: Secondary | ICD-10-CM

## 2020-01-06 MED ORDER — AMPHETAMINE-DEXTROAMPHETAMINE 15 MG PO TABS: 15.0000 mg | ORAL_TABLET | Freq: Two times a day (BID) | ORAL | 0 refills | Status: DC

## 2020-01-25 ENCOUNTER — Telehealth: Payer: Self-pay

## 2020-01-25 NOTE — Telephone Encounter (Signed)
Patient requesting note for 11/10/19 visit due to missing court for appointment. Requesting letter ASAP.

## 2020-01-30 ENCOUNTER — Ambulatory Visit: Payer: Federal, State, Local not specified - PPO | Admitting: Medical-Surgical

## 2020-02-02 ENCOUNTER — Ambulatory Visit: Payer: Self-pay | Admitting: Medical-Surgical

## 2020-02-05 ENCOUNTER — Other Ambulatory Visit: Payer: Self-pay | Admitting: Physician Assistant

## 2020-02-05 DIAGNOSIS — F902 Attention-deficit hyperactivity disorder, combined type: Secondary | ICD-10-CM

## 2020-02-06 NOTE — Telephone Encounter (Signed)
LVM for patient to call back to schedule appt. for refills. AM

## 2020-03-05 ENCOUNTER — Other Ambulatory Visit: Payer: Self-pay | Admitting: Physician Assistant

## 2020-03-05 DIAGNOSIS — F902 Attention-deficit hyperactivity disorder, combined type: Secondary | ICD-10-CM

## 2020-03-05 NOTE — Telephone Encounter (Signed)
Needs appt

## 2020-03-06 NOTE — Telephone Encounter (Signed)
MyChart message sent to pt with instructions to schedule OV.  Charyl Bigger, CMA

## 2020-03-13 ENCOUNTER — Ambulatory Visit (INDEPENDENT_AMBULATORY_CARE_PROVIDER_SITE_OTHER): Payer: Self-pay | Admitting: Physician Assistant

## 2020-03-13 ENCOUNTER — Other Ambulatory Visit: Payer: Self-pay

## 2020-03-13 ENCOUNTER — Encounter: Payer: Self-pay | Admitting: Physician Assistant

## 2020-03-13 VITALS — BP 130/80 | HR 105 | Ht 62.0 in | Wt 177.0 lb

## 2020-03-13 DIAGNOSIS — E041 Nontoxic single thyroid nodule: Secondary | ICD-10-CM

## 2020-03-13 DIAGNOSIS — F419 Anxiety disorder, unspecified: Secondary | ICD-10-CM

## 2020-03-13 DIAGNOSIS — R911 Solitary pulmonary nodule: Secondary | ICD-10-CM

## 2020-03-13 DIAGNOSIS — F902 Attention-deficit hyperactivity disorder, combined type: Secondary | ICD-10-CM

## 2020-03-13 DIAGNOSIS — D734 Cyst of spleen: Secondary | ICD-10-CM

## 2020-03-13 MED ORDER — AMPHETAMINE-DEXTROAMPHET ER 30 MG PO CP24: 30.0000 mg | ORAL_CAPSULE | ORAL | 0 refills | Status: DC

## 2020-03-13 MED ORDER — ALPRAZOLAM 0.25 MG PO TABS: 0.2500 mg | ORAL_TABLET | Freq: Two times a day (BID) | ORAL | 2 refills | Status: DC | PRN

## 2020-03-13 NOTE — Progress Notes (Addendum)
Subjective:    Patient ID: Patricia Potter, female    DOB: 06-19-1985, 34 y.o.   MRN: [id number]  HPI  Patricia Potter is here for a refill for her Adderall. She states that she is having a problem with the immediate release formulation running out in the early afternoon around 2-3pm and then taking her second dose and not being able to sleep at night. She was agreeable to the idea of switching to the extended release Adderall XR to help with the feeling of constantly coming on and off of the stimulant. She also states that her anxiety has been a bit worse over the past few months as she is going through a lot of change with moving away and is watching her two children. She says that occasional use of her Xanax helps to control these feelings of anxiety and to help her sleep. She also raised concerns about a past CT scan that was taken in 2019. She does not remember having anyone discuss the findings in it with her including a 88mm nodule in her lung, thyroid nodule, and splenic cyst.    .. Active Ambulatory Problems    Diagnosis Date Noted  . Papillary thyroid carcinoma ([place]) 07/14/2017  . Pilonidal cyst 07/14/2017  . Attention deficit hyperactivity disorder (ADHD), combined type 07/14/2017  . No energy 07/14/2017  . Constipation 07/14/2017  . Current smoker 07/14/2017  . Low serum vitamin B12 07/15/2017  . Vitamin D insufficiency 07/15/2017  . Low iron stores 07/15/2017  . Depression, recurrent ([place]) 09/08/2017  . Anxiety 09/08/2017  . Stress reaction 09/08/2017  . Mid-back pain, acute 09/08/2017  . Lumbar degenerative disc disease 04/15/2018  . Cervical radiculopathy 09/01/2018  . Neck pain 09/01/2018  . Laceration of thumb, left 10/13/2018  . Abscess of right external cheek 10/27/2018  . Pharyngitis 08/26/2019  . Pulmonary nodule 03/14/2020  . Splenic cyst 03/14/2020  . Thyroid nodule 03/14/2020   Resolved Ambulatory Problems    Diagnosis Date Noted  . No Resolved Ambulatory Problems    Past Medical History:  Diagnosis Date  . Papillary carcinoma ([place]) 2015      Review of Systems  Respiratory: Negative for apnea, chest tightness and shortness of breath.   Cardiovascular: Negative for chest pain and palpitations.  Gastrointestinal: Negative for abdominal pain, constipation and diarrhea.       Objective:   Physical Exam Constitutional:      Appearance: Normal appearance.  Cardiovascular:     Rate and Rhythm: Normal rate and regular rhythm.     Heart sounds: Normal heart sounds.  Pulmonary:     Effort: Pulmonary effort is normal.     Breath sounds: Normal breath sounds.  Skin:    General: Skin is warm and dry.  Neurological:     Mental Status: She is alert.  Psychiatric:        Mood and Affect: Mood normal.        Behavior: Behavior normal.        Thought Content: Thought content normal.    .. Depression screen [facility] 2/9 03/13/2020 11/10/2019 10/20/2019 04/20/2019 09/01/2018  Decreased Interest 2 1 1 1 1   Down, Depressed, Hopeless 1 2 1 3 1   PHQ - 2 Score 3 3 2 4 2   Altered sleeping 0 0 1 2 0  Tired, decreased energy 1 1 2 2  -  Change in appetite 2 3 0 0 0  Feeling bad or failure about yourself  0 1 1 0 0  Trouble concentrating 2 1 0 0 0  Moving slowly or fidgety/restless 2 2 0 0 -  Suicidal thoughts 0 0 0 0 0  PHQ-9 Score 10 11 6 8 2   Difficult doing work/chores Somewhat difficult Very difficult Somewhat difficult Somewhat difficult Not difficult at all   .Marland Kitchen GAD 7 : Generalized Anxiety Score 03/13/2020 11/10/2019 10/20/2019 04/20/2019  Nervous, Anxious, on Edge 2 3 1 3   Control/stop worrying 1 1 1 2   Worry too much - different things 1 1 0 2  Trouble relaxing 0 0 1 0  Restless 0 2 0 0  Easily annoyed or irritable 1 0 1 2  Afraid - awful might happen 0 0 0 2  Total GAD 7 Score 5 7 4 11   Anxiety Difficulty Somewhat difficult Somewhat difficult Somewhat difficult Somewhat difficult           Assessment & Plan:  Patricia Potter is a 34 year old female  with a history of ADHD,Anxiety, depression, thyroid cancer, peritonsilar abscess and is confused about past imaging results.   Marland KitchenElmyra Potter was seen today for adhd and obesity.  Diagnoses and all orders for this visit:  Attention deficit hyperactivity disorder (ADHD), combined type -     amphetamine-dextroamphetamine (ADDERALL XR) 30 MG 24 hr capsule; Take 1 capsule (30 mg total) by mouth every morning.  Anxiety -     ALPRAZolam (XANAX) 0.25 MG tablet; Take 1 tablet (0.25 mg total) by mouth 2 (two) times daily as needed for anxiety.  Pulmonary nodule  Splenic cyst  Thyroid nodule   Start XR Adderall for next month and let me know how this works. Good rx should make affordable.  Continue lexapro and buspar for mood. Xanax refilled for as needed.   Pt CT in question was done back in 2013. I review with her most recent imaging.  Pulmonary nodule was 9mm and no suggested follow up. Pt does not smoke. Thyroid cyst last u/s was 10/2019 and no reason for biopsy.  Reassured about splenic cyst.   Follow up in 3 months. Let me know in 1 month how XR adderall is doing.   Spent 30 minutes with patient discussing treatment plan and reassurance.   Marland KitchenVernetta Honey PA-C, have reviewed and agree with the above documentation in it's entirety.

## 2020-03-14 ENCOUNTER — Encounter: Payer: Self-pay | Admitting: Physician Assistant

## 2020-03-14 DIAGNOSIS — D734 Cyst of spleen: Secondary | ICD-10-CM | POA: Insufficient documentation

## 2020-03-14 DIAGNOSIS — R911 Solitary pulmonary nodule: Secondary | ICD-10-CM | POA: Insufficient documentation

## 2020-03-14 DIAGNOSIS — E041 Nontoxic single thyroid nodule: Secondary | ICD-10-CM | POA: Insufficient documentation

## 2020-03-16 DIAGNOSIS — U071 COVID-19: Secondary | ICD-10-CM

## 2020-03-16 HISTORY — DX: COVID-19: U07.1

## 2020-03-30 ENCOUNTER — Telehealth: Payer: Self-pay | Admitting: Neurology

## 2020-03-30 NOTE — Telephone Encounter (Signed)
Patient left vm stating she left medications out and her son got them wet in the sink. She is asking for what she should do next. [phone].

## 2020-04-03 NOTE — Telephone Encounter (Signed)
Left message on machine for patient to call back to discuss, apologized for delay in call as I was not here yesterday.

## 2020-04-12 ENCOUNTER — Other Ambulatory Visit: Payer: Self-pay | Admitting: Physician Assistant

## 2020-04-12 DIAGNOSIS — F902 Attention-deficit hyperactivity disorder, combined type: Secondary | ICD-10-CM

## 2020-04-12 NOTE — Telephone Encounter (Signed)
Last written 03/13/2020 #30 no refills Last appt 03/13/2020

## 2020-04-13 MED ORDER — AMPHETAMINE-DEXTROAMPHET ER 30 MG PO CP24: 30.0000 mg | ORAL_CAPSULE | ORAL | 0 refills | Status: DC

## 2020-04-13 MED ORDER — AMPHETAMINE-DEXTROAMPHET ER 30 MG PO CP24
30.0000 mg | ORAL_CAPSULE | ORAL | 0 refills | Status: DC
Start: 2020-05-14 — End: 2020-05-18

## 2020-05-14 ENCOUNTER — Other Ambulatory Visit: Payer: Self-pay | Admitting: Physician Assistant

## 2020-05-14 DIAGNOSIS — F902 Attention-deficit hyperactivity disorder, combined type: Secondary | ICD-10-CM

## 2020-05-14 NOTE — Telephone Encounter (Signed)
Already sent today, please sign denial.

## 2020-05-18 ENCOUNTER — Encounter: Payer: Self-pay | Admitting: Physician Assistant

## 2020-05-18 ENCOUNTER — Ambulatory Visit (INDEPENDENT_AMBULATORY_CARE_PROVIDER_SITE_OTHER): Payer: Self-pay | Admitting: Physician Assistant

## 2020-05-18 ENCOUNTER — Other Ambulatory Visit: Payer: Self-pay

## 2020-05-18 VITALS — BP 128/80 | HR 92 | Temp 97.9°F | Ht 62.0 in | Wt 177.0 lb

## 2020-05-18 DIAGNOSIS — H9201 Otalgia, right ear: Secondary | ICD-10-CM

## 2020-05-18 DIAGNOSIS — R195 Other fecal abnormalities: Secondary | ICD-10-CM

## 2020-05-18 DIAGNOSIS — K921 Melena: Secondary | ICD-10-CM | POA: Insufficient documentation

## 2020-05-18 DIAGNOSIS — M5030 Other cervical disc degeneration, unspecified cervical region: Secondary | ICD-10-CM | POA: Insufficient documentation

## 2020-05-18 DIAGNOSIS — Z791 Long term (current) use of non-steroidal anti-inflammatories (NSAID): Secondary | ICD-10-CM | POA: Insufficient documentation

## 2020-05-18 DIAGNOSIS — M542 Cervicalgia: Secondary | ICD-10-CM

## 2020-05-18 DIAGNOSIS — F17200 Nicotine dependence, unspecified, uncomplicated: Secondary | ICD-10-CM

## 2020-05-18 DIAGNOSIS — R5383 Other fatigue: Secondary | ICD-10-CM

## 2020-05-18 DIAGNOSIS — G8929 Other chronic pain: Secondary | ICD-10-CM

## 2020-05-18 DIAGNOSIS — R238 Other skin changes: Secondary | ICD-10-CM

## 2020-05-18 DIAGNOSIS — F902 Attention-deficit hyperactivity disorder, combined type: Secondary | ICD-10-CM

## 2020-05-18 DIAGNOSIS — R233 Spontaneous ecchymoses: Secondary | ICD-10-CM | POA: Insufficient documentation

## 2020-05-18 MED ORDER — AMPHETAMINE-DEXTROAMPHET ER 30 MG PO CP24: 30.0000 mg | ORAL_CAPSULE | ORAL | 0 refills | Status: DC

## 2020-05-18 MED ORDER — OMEPRAZOLE 40 MG PO CPDR: 40.0000 mg | DELAYED_RELEASE_CAPSULE | Freq: Every day | ORAL | 1 refills | Status: DC

## 2020-05-18 MED ORDER — DICLOFENAC SODIUM 1 % EX GEL: 4.0000 g | Freq: Four times a day (QID) | CUTANEOUS | 1 refills | Status: DC

## 2020-05-18 NOTE — Progress Notes (Signed)
Subjective:    Patient ID: Patricia Potter, female    DOB: 07-Sep-1985, 34 y.o.   MRN: [id number]  HPI  Pt is a 34 yo female with history of chronic back pain and degenerative disc disease who presents to the clinic with easy bruise that she just noticed yesterday and pain over the right mastoid.  Patient woke up with a large bruise on her right upper arm.  She denies any injury or trauma.  It is tender to touch.  It does not seem to be getting bigger.  She denies any arm weakness, numbness or tingling.  She has never had anything like this before.  She does admit to smoking regularly and daily use of NSAIDs as well as drinking coffee.  She does endorse frequent loose black sticky stools.  She estimates 5-7 times of stooling a day.  She does have a history of hemorrhoids but no active hemorrhoid and bleeding.  She seems to always be tired but more tired over the past month or so.  She admits to increase of reflux.  She denies any nausea, vomiting, abdominal pain, flank pain.  Patient has ongoing chronic neck pain but more recently she has had some tenderness over her right mastoid.  She denies any ear pain.  When she pushes on her mastoid she has numbness and tingling and pain that radiate down her neck.  Usually goes away within seconds.  No fever, chills, body aches.   Pt is doing well on adderall will need 3 month refill next months.   Active Ambulatory Problems    Diagnosis Date Noted  . Papillary thyroid carcinoma ([place]) 07/14/2017  . Pilonidal cyst 07/14/2017  . Attention deficit hyperactivity disorder (ADHD), combined type 07/14/2017  . No energy 07/14/2017  . Constipation 07/14/2017  . Current smoker 07/14/2017  . Low serum vitamin B12 07/15/2017  . Vitamin D insufficiency 07/15/2017  . Low iron stores 07/15/2017  . Depression, recurrent ([place]) 09/08/2017  . Anxiety 09/08/2017  . Stress reaction 09/08/2017  . Mid-back pain, acute 09/08/2017  . Lumbar degenerative disc disease  04/15/2018  . Cervical radiculopathy 09/01/2018  . Chronic neck pain 09/01/2018  . Abscess of right external cheek 10/27/2018  . Pharyngitis 08/26/2019  . Pulmonary nodule 03/14/2020  . Splenic cyst 03/14/2020  . Thyroid nodule 03/14/2020  . Bruises easily 05/18/2020  . Pain of right mastoid 05/18/2020  . Black tarry stools 05/18/2020  . NSAID long-term use 05/18/2020  . Loose stools 05/18/2020  . DDD (degenerative disc disease), cervical 05/18/2020   Resolved Ambulatory Problems    Diagnosis Date Noted  . Laceration of thumb, left 10/13/2018   Past Medical History:  Diagnosis Date  . Papillary carcinoma ([place]) 2015      Review of Systems See HPI.      Objective:   Physical Exam Vitals reviewed.  Constitutional:      Appearance: Normal appearance.  HENT:     Head: Normocephalic.     Right Ear: Tympanic membrane, ear canal and external ear normal.     Left Ear: Tympanic membrane, ear canal and external ear normal. There is no impacted cerumen.     Ears:     Comments: Some tenderness over mastoid right side. No warmth, swelling.     Nose: Nose normal.     Mouth/Throat:     Mouth: Mucous membranes are moist.  Eyes:     Extraocular Movements: Extraocular movements intact.     Conjunctiva/sclera: Conjunctivae normal.  Pupils: Pupils are equal, round, and reactive to light.  Cardiovascular:     Rate and Rhythm: Normal rate and regular rhythm.  Pulmonary:     Effort: Pulmonary effort is normal.     Breath sounds: Normal breath sounds.  Abdominal:     General: Bowel sounds are normal. There is no distension.     Palpations: Abdomen is soft.     Tenderness: There is no right CVA tenderness, left CVA tenderness, guarding or rebound.     Comments: Mild tenderness in the left lower abdomen.   Genitourinary:    Rectum: Normal. Guaiac result negative.  Musculoskeletal:     Comments: Right upper arm: large bruise 8.5cm by 5cm. Slightly tender to touch. No warmth.    Neurological:     General: No focal deficit present.     Mental Status: She is alert and oriented to person, place, and time.  Psychiatric:        Mood and Affect: Mood normal.           Assessment & Plan:  Marland KitchenMarland KitchenBelia was seen today for mass and bleeding/bruising.  Diagnoses and all orders for this visit:  Bruises easily -     CBC w/Diff/Platelet -     COMPLETE METABOLIC PANEL WITH GFR -     Lipase  Pain of right mastoid -     CBC w/Diff/Platelet -     COMPLETE METABOLIC PANEL WITH GFR -     Lipase -     diclofenac Sodium (VOLTAREN) 1 % GEL; Apply 4 g topically 4 (four) times daily. To affected joint.  No energy -     CBC w/Diff/Platelet -     COMPLETE METABOLIC PANEL WITH GFR -     Lipase  NSAID long-term use -     CBC w/Diff/Platelet -     COMPLETE METABOLIC PANEL WITH GFR -     Lipase -     omeprazole (PRILOSEC) 40 MG capsule; Take 1 capsule (40 mg total) by mouth daily.  Black tarry stools -     CBC w/Diff/Platelet -     COMPLETE METABOLIC PANEL WITH GFR -     Lipase -     omeprazole (PRILOSEC) 40 MG capsule; Take 1 capsule (40 mg total) by mouth daily.  Loose stools -     CBC w/Diff/Platelet -     COMPLETE METABOLIC PANEL WITH GFR -     Lipase  Chronic neck pain  DDD (degenerative disc disease), cervical  Current smoker  Attention deficit hyperactivity disorder (ADHD), combined type -     amphetamine-dextroamphetamine (ADDERALL XR) 30 MG 24 hr capsule; Take 1 capsule (30 mg total) by mouth every morning. -     amphetamine-dextroamphetamine (ADDERALL XR) 30 MG 24 hr capsule; Take 1 capsule (30 mg total) by mouth every morning. -     amphetamine-dextroamphetamine (ADDERALL XR) 30 MG 24 hr capsule; Take 1 capsule (30 mg total) by mouth every morning.   Unclear etiology of bruise.  It is only in one place on her body.  It is rather large.  Will check CBC, CMP, lipase. Concern for low platelets. It is possible she got injuried and just did not know. She  has not had any new medication changes.  I am suspicious of some gastritis/peptic ulcer.  She had a negative Hemoccult today.  Suggest backing off ibuprofen and using diclofenac gel instead.  Encourage lots of ice.  If she adds back in the ibuprofen she  needs to add omeprazole daily.  Since she is having the symptoms in general I would start omeprazole for the next month.  Discussed ways to prevent neck tightness and pain.  Follow-up if symptoms not improving or worsening.  Follow up 3 months or sooner.

## 2020-05-19 LAB — CBC WITH DIFFERENTIAL/PLATELET
Absolute Monocytes: 713 cells/uL (ref 200–950)
Basophils Absolute: 49 cells/uL (ref 0–200)
Basophils Relative: 0.6 %
Eosinophils Absolute: 365 cells/uL (ref 15–500)
Eosinophils Relative: 4.5 %
HCT: 40.9 % (ref 35.0–45.0)
Hemoglobin: 13.9 g/dL (ref 11.7–15.5)
Lymphs Abs: 3426 cells/uL (ref 850–3900)
MCH: 30.3 pg (ref 27.0–33.0)
MCHC: 34 g/dL (ref 32.0–36.0)
MCV: 89.1 fL (ref 80.0–100.0)
MPV: 10.6 fL (ref 7.5–12.5)
Monocytes Relative: 8.8 %
Neutro Abs: 3548 cells/uL (ref 1500–7800)
Neutrophils Relative %: 43.8 %
Platelets: 291 10*3/uL (ref 140–400)
RBC: 4.59 10*6/uL (ref 3.80–5.10)
RDW: 12.6 % (ref 11.0–15.0)
Total Lymphocyte: 42.3 %
WBC: 8.1 10*3/uL (ref 3.8–10.8)

## 2020-05-19 LAB — COMPLETE METABOLIC PANEL WITH GFR
AG Ratio: 1.5 (calc) (ref 1.0–2.5)
ALT: 14 U/L (ref 6–29)
AST: 27 U/L (ref 10–30)
Albumin: 4.3 g/dL (ref 3.6–5.1)
Alkaline phosphatase (APISO): 69 U/L (ref 31–125)
BUN: 11 mg/dL (ref 7–25)
CO2: 20 mmol/L (ref 20–32)
Calcium: 9.2 mg/dL (ref 8.6–10.2)
Chloride: 109 mmol/L (ref 98–110)
Creat: 0.68 mg/dL (ref 0.50–1.10)
GFR, Est African American: 134 mL/min/{1.73_m2} (ref >=60)
GFR, Est Non African American: 116 mL/min/{1.73_m2} (ref >=60)
Globulin: 2.8 g/dL (calc) (ref 1.9–3.7)
Glucose, Bld: 71 mg/dL (ref 65–99)
Potassium: 4.1 mmol/L (ref 3.5–5.3)
Sodium: 142 mmol/L (ref 135–146)
Total Bilirubin: 0.2 mg/dL (ref 0.2–1.2)
Total Protein: 7.1 g/dL (ref 6.1–8.1)

## 2020-05-19 LAB — LIPASE: Lipase: 31 U/L (ref 7–60)

## 2020-05-21 NOTE — Progress Notes (Signed)
Mariateresa,   Labs are great.

## 2020-06-07 ENCOUNTER — Emergency Department: Admit: 2020-06-07 | Discharge status: Absent without leave | Payer: Self-pay

## 2020-06-07 ENCOUNTER — Encounter: Payer: Self-pay | Admitting: Nurse Practitioner

## 2020-06-07 ENCOUNTER — Ambulatory Visit (INDEPENDENT_AMBULATORY_CARE_PROVIDER_SITE_OTHER): Payer: Self-pay | Admitting: Nurse Practitioner

## 2020-06-07 ENCOUNTER — Other Ambulatory Visit: Payer: Self-pay

## 2020-06-07 ENCOUNTER — Telehealth: Payer: Self-pay | Admitting: Physician Assistant

## 2020-06-07 VITALS — BP 110/69 | HR 98 | Temp 98.1°F | Ht 62.0 in | Wt 175.4 lb

## 2020-06-07 DIAGNOSIS — R399 Unspecified symptoms and signs involving the genitourinary system: Secondary | ICD-10-CM

## 2020-06-07 DIAGNOSIS — N3090 Cystitis, unspecified without hematuria: Secondary | ICD-10-CM

## 2020-06-07 DIAGNOSIS — R300 Dysuria: Secondary | ICD-10-CM

## 2020-06-07 LAB — POCT URINALYSIS DIP (CLINITEK)
Glucose, UA: 100 mg/dL — AB
Nitrite, UA: POSITIVE — AB
POC PROTEIN,UA: >=300 — AB
Spec Grav, UA: <=1.005 — AB (ref 1.010–1.025)
Urobilinogen, UA: 2 E.U./dL — AB
pH, UA: 5 (ref 5.0–8.0)

## 2020-06-07 MED ORDER — NITROFURANTOIN MONOHYD MACRO 100 MG PO CAPS: 100.0000 mg | ORAL_CAPSULE | Freq: Two times a day (BID) | ORAL | 0 refills | Status: DC

## 2020-06-07 NOTE — Patient Instructions (Signed)
Urinary Tract Infection, Adult A urinary tract infection (UTI) is an infection of any part of the urinary tract. The urinary tract includes:  The kidneys.  The ureters.  The bladder.  The urethra. These organs make, store, and get rid of pee (urine) in the body. What are the causes? This is caused by germs (bacteria) in your genital area. These germs grow and cause swelling (inflammation) of your urinary tract. What increases the risk? You are more likely to develop this condition if:  You have a small, thin tube (catheter) to drain pee.  You cannot control when you pee or poop (incontinence).  You are female, and: ? You use these methods to prevent pregnancy:  A medicine that kills sperm (spermicide).  A device that blocks sperm (diaphragm). ? You have low levels of a female hormone (estrogen). ? You are pregnant.  You have genes that add to your risk.  You are sexually active.  You take antibiotic medicines.  You have trouble peeing because of: ? A prostate that is bigger than normal, if you are female. ? A blockage in the part of your body that drains pee from the bladder (urethra). ? A kidney stone. ? A nerve condition that affects your bladder (neurogenic bladder). ? Not getting enough to drink. ? Not peeing often enough.  You have other conditions, such as: ? Diabetes. ? A weak disease-fighting system (immune system). ? Sickle cell disease. ? Gout. ? Injury of the spine. What are the signs or symptoms? Symptoms of this condition include:  Needing to pee right away (urgently).  Peeing often.  Peeing small amounts often.  Pain or burning when peeing.  Blood in the pee.  Pee that smells bad or not like normal.  Trouble peeing.  Pee that is cloudy.  Fluid coming from the vagina, if you are female.  Pain in the belly or lower back. Other symptoms include:  Throwing up (vomiting).  No urge to eat.  Feeling mixed up (confused).  Being tired  and grouchy (irritable).  A fever.  Watery poop (diarrhea). How is this treated? This condition may be treated with:  Antibiotic medicine.  Other medicines.  Drinking enough water. Follow these instructions at home:  Medicines  Take over-the-counter and prescription medicines only as told by your doctor.  If you were prescribed an antibiotic medicine, take it as told by your doctor. Do not stop taking it even if you start to feel better. General instructions  Make sure you: ? Pee until your bladder is empty. ? Do not hold pee for a long time. ? Empty your bladder after sex. ? Wipe from front to back after pooping if you are a female. Use each tissue one time when you wipe.  Drink enough fluid to keep your pee pale yellow.  Keep all follow-up visits as told by your doctor. This is important. Contact a doctor if:  You do not get better after 1-2 days.  Your symptoms go away and then come back. Get help right away if:  You have very bad back pain.  You have very bad pain in your lower belly.  You have a fever.  You are sick to your stomach (nauseous).  You are throwing up. Summary  A urinary tract infection (UTI) is an infection of any part of the urinary tract.  This condition is caused by germs in your genital area.  There are many risk factors for a UTI. These include having a small, thin   tube to drain pee and not being able to control when you pee or poop.  Treatment includes antibiotic medicines for germs.  Drink enough fluid to keep your pee pale yellow. This information is not intended to replace advice given to you by your health care provider. Make sure you discuss any questions you have with your health care provider. Document Revised: 05/20/2018 Document Reviewed: 12/10/2017 Elsevier Patient Education  2020 Elsevier Inc.  

## 2020-06-07 NOTE — Progress Notes (Signed)
Hi Elmyra Ricks, Thank you for the information.  I am concerned about a possible UTI that's worsened and may have turned into a kidney infection.  Unfortunately, this is too complex of a problem to take care of via E-visit.  You need labs and possible imaging.   Based on what you shared with me, I feel your condition warrants further evaluation and I recommend that you be seen for a face to face office visit as soon as possible.   NOTE: If you entered your credit card information for this eVisit, you will not be charged. You may see a "hold" on your card for the $35 but that hold will drop off and you will not have a charge processed.   If you are having a true medical emergency please call 911.      For an urgent face to face visit, [facility] has five urgent care centers for your convenience:     [facility] [place] Get Driving Directions [phone] [address], [place] [address] . 10 am - 6pm Monday - Friday    [facility]) Get Driving Directions [phone] [address] [place], [place] [address] . 10 am to 8 pm Monday-Friday . 12 pm to 8 pm [facility]  Get Driving Directions [phone] [address], [place], [place] [address] . 8 am to 8 pm Monday-Friday . 9 am to 6 pm Saturday . 11 am to 6 pm Sunday     [facility] [place] Get Driving Directions  [phone] [address] [place], [place] [address] . 8 am to 8 pm Monday-Friday . 8 am to 4 pm [facility] [place] Get Driving Directions [phone] [address] [place], [place] [address] . 12 pm to 6 pm Monday-Friday      Your e-visit answers were reviewed by a board certified advanced clinical practitioner to complete your personal care plan.  Thank you for using e-Visits.

## 2020-06-07 NOTE — Progress Notes (Signed)
Acute Office Visit  Subjective:    Patient ID: Patricia Potter, female    DOB: 05-14-1986, 34 y.o.   MRN: [id number]  Chief Complaint  Patient presents with  . Dysuria    HPI Patient is in today for dysuria that started about 2 days ago. She reports that she increased her water intake and her symptoms improved, but this morning she woke up and noticed blood in her urine and became concerned.   She endorses right costovertebral angle tenderness and aching, intermittent nausea, dysuria, frequency, and blood in her urine.   She denies known fevers, abdominal pain, diarrhea, vomiting.   She started taking AZO this morning to help with the symptoms.  Past Medical History:  Diagnosis Date  . Papillary carcinoma ([facility]) 2015    Past Surgical History:  Procedure Laterality Date  .  Thyroidectomy Left 2015    History reviewed. No pertinent family history.  Social History   Socioeconomic History  . Marital status: Legally Separated    Spouse name: Not on file  . Number of children: Not on file  . Years of education: Not on file  . Highest education level: Not on file  Occupational History  . Not on file  Tobacco Use  . Smoking status: Unknown If Ever Smoked  . Smokeless tobacco: Never Used  Substance and Sexual Activity  . Alcohol use: Not on file  . Drug use: Not on file  . Sexual activity: Not on file  Other Topics Concern  . Not on file  Social History Narrative  . Not on file   Social Determinants of Health   Financial Resource Strain: Not on file  Food Insecurity: Not on file  Transportation Needs: Not on file  Physical Activity: Not on file  Stress: Not on file  Social Connections: Not on file  Intimate Partner Violence: Not on file    Outpatient Medications Prior to Visit  Medication Sig Dispense Refill  . ALPRAZolam (XANAX) 0.25 MG tablet Take 1 tablet (0.25 mg total) by mouth 2 (two) times daily as needed for anxiety. 30 tablet 2  .  amphetamine-dextroamphetamine (ADDERALL XR) 30 MG 24 hr capsule Take 1 capsule (30 mg total) by mouth every morning. 30 capsule 0  . [START ON 07/10/2020] amphetamine-dextroamphetamine (ADDERALL XR) 30 MG 24 hr capsule Take 1 capsule (30 mg total) by mouth every morning. 30 capsule 0  . [START ON 08/10/2020] amphetamine-dextroamphetamine (ADDERALL XR) 30 MG 24 hr capsule Take 1 capsule (30 mg total) by mouth every morning. 30 capsule 0  . busPIRone (BUSPAR) 10 MG tablet Take 1 tablet by mouth 2 (two) times daily.    . diclofenac Sodium (VOLTAREN) 1 % GEL Apply 4 g topically 4 (four) times daily. To affected joint. 100 g 1  . escitalopram (LEXAPRO) 20 MG tablet Take 1.5 tablets (30 mg total) by mouth daily. 135 tablet 3  . omeprazole (PRILOSEC) 40 MG capsule Take 1 capsule (40 mg total) by mouth daily. 90 capsule 1  . busPIRone (BUSPAR) 5 MG tablet Take 1 tablet (5 mg total) by mouth 3 (three) times daily. 270 tablet 3   No facility-administered medications prior to visit.    Allergies  Allergen Reactions  . Iodine     Review of Systems All review of systems negative except what is listed in the HPI     Objective:    Physical Exam Vitals and nursing note reviewed.  Constitutional:      Appearance: Normal appearance.  Cardiovascular:     Rate and Rhythm: Normal rate and regular rhythm.     Pulses: Normal pulses.     Heart sounds: Normal heart sounds.  Pulmonary:     Effort: Pulmonary effort is normal.     Breath sounds: Normal breath sounds.  Abdominal:     General: Abdomen is flat. Bowel sounds are normal. There is no distension.     Palpations: Abdomen is soft.     Tenderness: There is no abdominal tenderness. There is right CVA tenderness. There is no left CVA tenderness or guarding.  Musculoskeletal:        General: Normal range of motion.     Right lower leg: No edema.     Left lower leg: No edema.  Skin:    General: Skin is warm and dry.     Capillary Refill: Capillary  refill takes less than 2 seconds.  Neurological:     General: No focal deficit present.     Mental Status: She is alert and oriented to person, place, and time.  Psychiatric:        Mood and Affect: Mood normal.        Behavior: Behavior normal.        Thought Content: Thought content normal.        Judgment: Judgment normal.     BP 110/69   Pulse 98   Temp 98.1 F (36.7 C)   Ht 5\' 2"  (1.575 m)   Wt 175 lb 6.4 oz (79.6 kg)   LMP 09/09/2019   SpO2 100%   BMI 32.08 kg/m  Wt Readings from Last 3 Encounters:  06/07/20 175 lb 6.4 oz (79.6 kg)  05/18/20 177 lb (80.3 kg)  03/13/20 177 lb (80.3 kg)    There are no preventive care reminders to display for this patient.  There are no preventive care reminders to display for this patient.   Lab Results  Component Value Date   TSH 1.57 07/14/2017   Lab Results  Component Value Date   WBC 8.1 05/18/2020   HGB 13.9 05/18/2020   HCT 40.9 05/18/2020   MCV 89.1 05/18/2020   PLT 291 05/18/2020   Lab Results  Component Value Date   NA 142 05/18/2020   K 4.1 05/18/2020   CO2 20 05/18/2020   GLUCOSE 71 05/18/2020   BUN 11 05/18/2020   CREATININE 0.68 05/18/2020   BILITOT 0.2 05/18/2020   AST 27 05/18/2020   ALT 14 05/18/2020   PROT 7.1 05/18/2020   CALCIUM 9.2 05/18/2020   Lab Results  Component Value Date   CHOL 182 07/14/2017   Lab Results  Component Value Date   HDL 46 (L) 07/14/2017   Lab Results  Component Value Date   LDLCALC 115 (H) 07/14/2017   Lab Results  Component Value Date   TRIG 104 07/14/2017   Lab Results  Component Value Date   CHOLHDL 4.0 07/14/2017   No results found for: HGBA1C     Assessment & Plan:   1. Dysuria 2. Cystitis UA in office today does show presence of large blood and leukocytes with positive nitrites. Will send for culture for complete evaluation. No fever or signs of systemic symptoms today, but she is showing moderate tenderness to the right CVA.  Will start  treatment today with macrobid x 7 and recommend increase water consumption and continue AZO for symptoms for maximum of 2 days.  Recommend monitoring for worsening signs of infection and notifying the office  if these present immediately. Follow-up if symptoms worsen or fail to improve.  - nitrofurantoin, macrocrystal-monohydrate, (MACROBID) 100 MG capsule; Take 1 capsule (100 mg total) by mouth 2 (two) times daily.  Dispense: 14 capsule; Refill: 0 - POCT URINALYSIS DIP (CLINITEK) - Urine Culture  Orma Render, NP

## 2020-06-09 LAB — URINE CULTURE
MICRO NUMBER:: 11354708
SPECIMEN QUALITY:: ADEQUATE

## 2020-06-12 ENCOUNTER — Other Ambulatory Visit: Payer: Self-pay | Admitting: Physician Assistant

## 2020-06-12 DIAGNOSIS — F902 Attention-deficit hyperactivity disorder, combined type: Secondary | ICD-10-CM

## 2020-06-12 NOTE — Progress Notes (Signed)
Urine culture positive for e. Coli. The antibiotic prescribed is shown to cover this. If symptoms continue, please let us know.

## 2020-06-18 MED ORDER — AMPHETAMINE-DEXTROAMPHET ER 30 MG PO CP24: 30.0000 mg | ORAL_CAPSULE | ORAL | 0 refills | Status: DC

## 2020-08-13 ENCOUNTER — Other Ambulatory Visit: Payer: Self-pay | Admitting: Physician Assistant

## 2020-08-13 DIAGNOSIS — F902 Attention-deficit hyperactivity disorder, combined type: Secondary | ICD-10-CM

## 2020-08-14 ENCOUNTER — Telehealth: Payer: Self-pay | Admitting: Physician Assistant

## 2020-08-14 NOTE — Telephone Encounter (Signed)
Adderall dated for 08/10/2020 already sent to pharmacy. Patient made aware.

## 2020-08-14 NOTE — Telephone Encounter (Signed)
Pt called.  She needs refill on her Adderall:request was sent over by her pharmacy.  Thanks.

## 2020-09-28 ENCOUNTER — Other Ambulatory Visit: Payer: Self-pay | Admitting: Physician Assistant

## 2020-09-28 DIAGNOSIS — F902 Attention-deficit hyperactivity disorder, combined type: Secondary | ICD-10-CM

## 2020-10-02 NOTE — Telephone Encounter (Signed)
Needs appt

## 2020-10-16 ENCOUNTER — Encounter: Payer: Self-pay | Admitting: Physician Assistant

## 2020-10-16 ENCOUNTER — Telehealth: Payer: Medicaid Other | Admitting: Physician Assistant

## 2020-10-16 ENCOUNTER — Telehealth: Payer: Self-pay | Admitting: Physician Assistant

## 2020-10-16 DIAGNOSIS — B9689 Other specified bacterial agents as the cause of diseases classified elsewhere: Secondary | ICD-10-CM

## 2020-10-16 DIAGNOSIS — J029 Acute pharyngitis, unspecified: Secondary | ICD-10-CM

## 2020-10-16 DIAGNOSIS — J028 Acute pharyngitis due to other specified organisms: Secondary | ICD-10-CM

## 2020-10-16 MED ORDER — DOXYCYCLINE HYCLATE 100 MG PO CAPS: 100.0000 mg | ORAL_CAPSULE | Freq: Two times a day (BID) | ORAL | 0 refills | Status: DC
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
## 2020-10-16 NOTE — Patient Instructions (Addendum)
1. Acute bacterial pharyngitis  - Suspect early bacterial pharyngitis.  Given history of complicated peritonsillar abscess will treat with doxycycline today.  - Close primary care or urgent care follow-up in 2 days if no improvement.  To ER for difficulty breathing, worsening swelling, high fevers or other concerns.   Pharyngitis  Pharyngitis is redness, pain, and swelling (inflammation) of the throat (pharynx). It is a very common cause of sore throat. Pharyngitis can be caused by a bacteria, but it is usually caused by a virus. Most cases of pharyngitis get better on their own without treatment. What are the causes? This condition may be caused by:  Infection by viruses (viral). Viral pharyngitis spreads from person to person (is contagious) through coughing, sneezing, and sharing of personal items or utensils such as cups, forks, spoons, and toothbrushes.  Infection by bacteria (bacterial). Bacterial pharyngitis may be spread by touching the nose or face after coming in contact with the bacteria, or through more intimate contact, such as kissing.  Allergies. Allergies can cause buildup of mucus in the throat (post-nasal drip), leading to inflammation and irritation. Allergies can also cause blocked nasal passages, forcing breathing through the mouth, which dries and irritates the throat. What increases the risk? You are more likely to develop this condition if:  You are 34-69 years old.  You are exposed to crowded environments such as daycare, school, or dormitory living.  You live in a cold climate.  You have a weakened disease-fighting (immune) system. What are the signs or symptoms? Symptoms of this condition vary by the cause (viral, bacterial, or allergies) and can include:  Sore throat.  Fatigue.  Low-grade fever.  Headache.  Joint pain and muscle aches.  Skin rashes.  Swollen glands in the throat (lymph nodes).  Plaque-like film on the throat or tonsils. This is  often a symptom of bacterial pharyngitis.  Vomiting.  Stuffy nose (nasal congestion).  Cough.  Red, itchy eyes (conjunctivitis).  Loss of appetite. How is this diagnosed? This condition is often diagnosed based on your medical history and a physical exam. Your health care provider will ask you questions about your illness and your symptoms. A swab of your throat may be done to check for bacteria (rapid strep test). Other lab tests may also be done, depending on the suspected cause, but these are rare. How is this treated? This condition usually gets better in 3-4 days without medicine. Bacterial pharyngitis may be treated with antibiotic medicines. Follow these instructions at home:  Take over-the-counter and prescription medicines only as told by your health care provider. ? If you were prescribed an antibiotic medicine, take it as told by your health care provider. Do not stop taking the antibiotic even if you start to feel better. ? Do not give children aspirin because of the association with Reye syndrome.  Drink enough water and fluids to keep your urine clear or pale yellow.  Get a lot of rest.  Gargle with a salt-water mixture 3-4 times a day or as needed. To make a salt-water mixture, completely dissolve -1 tsp of salt in 1 cup of warm water.  If your health care provider approves, you may use throat lozenges or sprays to soothe your throat. Contact a health care provider if:  You have large, tender lumps in your neck.  You have a rash.  You cough up green, yellow-brown, or bloody spit. Get help right away if:  Your neck becomes stiff.  You drool or are unable to  swallow liquids.  You cannot drink or take medicines without vomiting.  You have severe pain that does not go away, even after you take medicine.  You have trouble breathing, and it is not caused by a stuffy nose.  You have new pain and swelling in your joints such as the knees, ankles, wrists, or  elbows. Summary  Pharyngitis is redness, pain, and swelling (inflammation) of the throat (pharynx).  While pharyngitis can be caused by a bacteria, the most common causes are viral.  Most cases of pharyngitis get better on their own without treatment.  Bacterial pharyngitis is treated with antibiotic medicines. This information is not intended to replace advice given to you by your health care provider. Make sure you discuss any questions you have with your health care provider. Document Revised: 05/15/2017 Document Reviewed: 07/08/2016 Elsevier Patient Education  2021 [employer].

## 2020-10-16 NOTE — Progress Notes (Signed)
Duplicate - pt completed video visit with myself.

## 2020-10-16 NOTE — Progress Notes (Signed)
Chart reviewed.  Pt with c/o sore throat.  No show for appointment despite several epic and text notifications.

## 2020-10-19 ENCOUNTER — Telehealth: Payer: Medicaid Other | Admitting: Nurse Practitioner

## 2020-10-19 DIAGNOSIS — R110 Nausea: Secondary | ICD-10-CM | POA: Diagnosis not present

## 2020-10-20 MED ORDER — AMOXICILLIN 500 MG PO CAPS: 500.0000 mg | ORAL_CAPSULE | Freq: Two times a day (BID) | ORAL | 0 refills | Status: AC

## 2020-10-20 MED ORDER — ONDANSETRON HCL 4 MG PO TABS: 4.0000 mg | ORAL_TABLET | Freq: Three times a day (TID) | ORAL | 0 refills | Status: DC | PRN

## 2020-10-20 NOTE — Progress Notes (Signed)
We are sorry that you are not feeling well. Here is how we plan to help!  Based on what you have shared with me it looks like you have a possible reaction to doxycycline. I would stop taling it now. I have changed you to amoxicillin 500mg  1 po BID for 5 days.   Vomiting is the forceful emptying of a portion of the stomach's content through the mouth.  Although nausea and vomiting can make you feel miserable, it's important to remember that these are not diseases, but rather symptoms of an underlying illness.  When we treat short term symptoms, we always caution that any symptoms that persist should be fully evaluated in a medical office.  I have prescribed a medication that will help alleviate your symptoms and allow you to stay hydrated:  Zofran 4 mg 1 tablet every 8 hours as needed for nausea and vomiting  HOME CARE:  Drink clear liquids.  This is very important! Dehydration (the lack of fluid) can lead to a serious complication.  Start off with 1 tablespoon every 5 minutes for 8 hours.  You may begin eating bland foods after 8 hours without vomiting.  Start with saltine crackers, white bread, rice, mashed potatoes, applesauce.  After 48 hours on a bland diet, you may resume a normal diet.  Try to go to sleep.  Sleep often empties the stomach and relieves the need to vomit.  GET HELP RIGHT AWAY IF:   Your symptoms do not improve or worsen within 2 days after treatment.  You have a fever for over 3 days.  You cannot keep down fluids after trying the medication.  MAKE SURE YOU:   Understand these instructions.  Will watch your condition.  Will get help right away if you are not doing well or get worse.   Thank you for choosing an e-visit. Your e-visit answers were reviewed by a board certified advanced clinical practitioner to complete your personal care plan. Depending upon the condition, your plan could have included both over the counter or prescription medications. Please  review your pharmacy choice. Be sure that the pharmacy you have chosen is open so that you can pick up your prescription now.  If there is a problem you may message your provider in [place] to have the prescription routed to another pharmacy. Your safety is important to [place]. If you have drug allergies check your prescription carefully.  For the next 24 hours, you can use MyChart to ask questions about today's visit, request a non-urgent call back, or ask for a work or school excuse from your e-visit provider. You will get an e-mail in the next two days asking about your experience. I hope that your e-visit has been valuable and will speed your recovery.  5-10 minutes spent reviewing and documenting in chart.

## 2020-11-13 ENCOUNTER — Other Ambulatory Visit: Payer: Self-pay | Admitting: Physician Assistant

## 2020-11-13 DIAGNOSIS — F902 Attention-deficit hyperactivity disorder, combined type: Secondary | ICD-10-CM

## 2020-11-13 DIAGNOSIS — F419 Anxiety disorder, unspecified: Secondary | ICD-10-CM

## 2020-11-14 NOTE — Telephone Encounter (Signed)
Please call patient for appt.  

## 2020-11-14 NOTE — Telephone Encounter (Signed)
Needs appt

## 2020-11-14 NOTE — Telephone Encounter (Signed)
Last written 03/13/2020 #30 with 2 refills Last appt 05/18/2020

## 2020-11-15 DIAGNOSIS — F902 Attention-deficit hyperactivity disorder, combined type: Secondary | ICD-10-CM

## 2020-11-15 NOTE — Telephone Encounter (Signed)
Patient's phone rings twice and goes busy I sent her a mychart message to call and schedule an appointment to get her refill. - CF

## 2020-11-28 ENCOUNTER — Other Ambulatory Visit: Payer: Self-pay

## 2020-11-28 ENCOUNTER — Emergency Department (INDEPENDENT_AMBULATORY_CARE_PROVIDER_SITE_OTHER)
Admission: EM | Admit: 2020-11-28 | Discharge: 2020-11-28 | Disposition: A | Discharge status: Home / Self Care | Payer: Medicaid Other | Source: Home / Self Care

## 2020-11-28 ENCOUNTER — Emergency Department: Admission: EM | Admit: 2020-11-28 | Discharge: 2020-11-28 | Discharge status: Absent without leave | Payer: Self-pay

## 2020-11-28 DIAGNOSIS — S61211A Laceration without foreign body of left index finger without damage to nail, initial encounter: Secondary | ICD-10-CM | POA: Diagnosis not present

## 2020-11-28 NOTE — Discharge Instructions (Addendum)
Wound cleaned and glued today. Continue to keep clean and covered - keep dry until glue comes off. Return to care if increasing redness, pain, swelling, or drainage from the wound.
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
## 2020-11-28 NOTE — ED Triage Notes (Signed)
Pt presents to Urgent Care with c/o laceration to tip of L index finger. States she was slicing a bagel and cut the tip of her finger. Pt does not recall date of last Tetanus shot.

## 2020-12-19 ENCOUNTER — Other Ambulatory Visit: Payer: Self-pay

## 2020-12-19 ENCOUNTER — Encounter: Payer: Self-pay | Admitting: Physician Assistant

## 2020-12-19 ENCOUNTER — Ambulatory Visit: Payer: Medicaid Other | Admitting: Physician Assistant

## 2020-12-19 VITALS — BP 132/74 | HR 102 | Ht 62.0 in | Wt 183.0 lb

## 2020-12-19 DIAGNOSIS — F419 Anxiety disorder, unspecified: Secondary | ICD-10-CM | POA: Diagnosis not present

## 2020-12-19 DIAGNOSIS — Z803 Family history of malignant neoplasm of breast: Secondary | ICD-10-CM | POA: Diagnosis not present

## 2020-12-19 DIAGNOSIS — F902 Attention-deficit hyperactivity disorder, combined type: Secondary | ICD-10-CM

## 2020-12-19 DIAGNOSIS — F339 Major depressive disorder, recurrent, unspecified: Secondary | ICD-10-CM | POA: Diagnosis not present

## 2020-12-19 DIAGNOSIS — N6452 Nipple discharge: Secondary | ICD-10-CM | POA: Diagnosis not present

## 2020-12-19 DIAGNOSIS — N6310 Unspecified lump in the right breast, unspecified quadrant: Secondary | ICD-10-CM | POA: Diagnosis not present

## 2020-12-19 MED ORDER — ESCITALOPRAM OXALATE 20 MG PO TABS: 30.0000 mg | ORAL_TABLET | Freq: Every day | ORAL | 3 refills | Status: DC

## 2020-12-19 MED ORDER — AMPHETAMINE-DEXTROAMPHETAMINE 15 MG PO TABS: 15.0000 mg | ORAL_TABLET | Freq: Every day | ORAL | 0 refills | Status: DC

## 2020-12-19 MED ORDER — AMPHETAMINE-DEXTROAMPHETAMINE 15 MG PO TABS: 15.0000 mg | ORAL_TABLET | Freq: Two times a day (BID) | ORAL | 0 refills | Status: DC

## 2020-12-19 NOTE — Patient Instructions (Addendum)
Bumps surrounding nipple [place] tubercles
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
## 2020-12-20 ENCOUNTER — Other Ambulatory Visit: Payer: Self-pay | Admitting: Neurology

## 2020-12-20 DIAGNOSIS — R748 Abnormal levels of other serum enzymes: Secondary | ICD-10-CM

## 2020-12-20 LAB — CBC WITH DIFFERENTIAL/PLATELET
Absolute Monocytes: 745 cells/uL (ref 200–950)
Basophils Absolute: 65 cells/uL (ref 0–200)
Basophils Relative: 0.6 %
Eosinophils Absolute: 443 cells/uL (ref 15–500)
Eosinophils Relative: 4.1 %
HCT: 41.8 % (ref 35.0–45.0)
Hemoglobin: 14.1 g/dL (ref 11.7–15.5)
Lymphs Abs: 3402 cells/uL (ref 850–3900)
MCH: 31.3 pg (ref 27.0–33.0)
MCHC: 33.7 g/dL (ref 32.0–36.0)
MCV: 92.7 fL (ref 80.0–100.0)
MPV: 10.8 fL (ref 7.5–12.5)
Monocytes Relative: 6.9 %
Neutro Abs: 6145 cells/uL (ref 1500–7800)
Neutrophils Relative %: 56.9 %
Platelets: 351 10*3/uL (ref 140–400)
RBC: 4.51 10*6/uL (ref 3.80–5.10)
RDW: 13.1 % (ref 11.0–15.0)
Total Lymphocyte: 31.5 %
WBC: 10.8 10*3/uL (ref 3.8–10.8)

## 2020-12-20 LAB — TSH: TSH: 1.98 mIU/L

## 2020-12-20 LAB — COMPLETE METABOLIC PANEL WITH GFR
AG Ratio: 1.7 (calc) (ref 1.0–2.5)
ALT: 21 U/L (ref 6–29)
AST: 36 U/L — ABNORMAL HIGH (ref 10–30)
Albumin: 4.5 g/dL (ref 3.6–5.1)
Alkaline phosphatase (APISO): 91 U/L (ref 31–125)
BUN: 11 mg/dL (ref 7–25)
CO2: 23 mmol/L (ref 20–32)
Calcium: 9.7 mg/dL (ref 8.6–10.2)
Chloride: 104 mmol/L (ref 98–110)
Creat: 0.66 mg/dL (ref 0.50–1.10)
GFR, Est African American: 135 mL/min/{1.73_m2} (ref >=60)
GFR, Est Non African American: 116 mL/min/{1.73_m2} (ref >=60)
Globulin: 2.7 g/dL (calc) (ref 1.9–3.7)
Glucose, Bld: 88 mg/dL (ref 65–99)
Potassium: 4.5 mmol/L (ref 3.5–5.3)
Sodium: 136 mmol/L (ref 135–146)
Total Bilirubin: 0.3 mg/dL (ref 0.2–1.2)
Total Protein: 7.2 g/dL (ref 6.1–8.1)

## 2020-12-20 LAB — PROLACTIN: Prolactin: 6 ng/mL

## 2020-12-20 NOTE — Progress Notes (Signed)
Elmyra Ricks,   Prolactin normal.  Thyroid perfect.  Hemoglobin looks great.  One liver enzyme just a hair up.  Avoid alcohol and tylenol and recheck in 4 weeks and see if resolve.

## 2020-12-25 ENCOUNTER — Encounter: Payer: Self-pay | Admitting: Physician Assistant

## 2020-12-25 DIAGNOSIS — N6310 Unspecified lump in the right breast, unspecified quadrant: Secondary | ICD-10-CM | POA: Insufficient documentation

## 2020-12-25 DIAGNOSIS — Z803 Family history of malignant neoplasm of breast: Secondary | ICD-10-CM | POA: Insufficient documentation

## 2020-12-26 ENCOUNTER — Other Ambulatory Visit: Payer: Self-pay | Admitting: Physician Assistant

## 2020-12-26 DIAGNOSIS — Z803 Family history of malignant neoplasm of breast: Secondary | ICD-10-CM

## 2020-12-26 DIAGNOSIS — N6310 Unspecified lump in the right breast, unspecified quadrant: Secondary | ICD-10-CM

## 2021-01-14 ENCOUNTER — Other Ambulatory Visit: Payer: Self-pay | Admitting: Physician Assistant

## 2021-01-14 DIAGNOSIS — F902 Attention-deficit hyperactivity disorder, combined type: Secondary | ICD-10-CM

## 2021-01-17 ENCOUNTER — Telehealth: Payer: Medicaid Other | Admitting: Physician Assistant

## 2021-01-17 DIAGNOSIS — B9689 Other specified bacterial agents as the cause of diseases classified elsewhere: Secondary | ICD-10-CM | POA: Diagnosis not present

## 2021-01-17 DIAGNOSIS — J208 Acute bronchitis due to other specified organisms: Secondary | ICD-10-CM

## 2021-01-17 MED ORDER — BENZONATATE 100 MG PO CAPS: 100.0000 mg | ORAL_CAPSULE | Freq: Three times a day (TID) | ORAL | 0 refills | Status: DC | PRN

## 2021-01-17 MED ORDER — DOXYCYCLINE HYCLATE 100 MG PO TABS: 100.0000 mg | ORAL_TABLET | Freq: Two times a day (BID) | ORAL | 0 refills | Status: DC

## 2021-01-17 NOTE — Patient Instructions (Signed)
Patricia Potter, thank you for joining Leeanne Rio, PA-C for today's virtual visit.  While this provider is not your primary care provider (PCP), if your PCP is located in our provider database this encounter information will be shared with them immediately following your visit.  Consent: (Patient) Patricia Potter provided verbal consent for this virtual visit at the beginning of the encounter.  Current Medications:  Current Outpatient Medications:    ALPRAZolam (XANAX) 0.25 MG tablet, TAKE ONE TABLET BY MOUTH TWICE A DAY AS NEEDED FOR ANXIETY, Disp: 30 tablet, Rfl: 0   amphetamine-dextroamphetamine (ADDERALL) 15 MG tablet, Take 1 tablet by mouth 2 (two) times daily., Disp: 60 tablet, Rfl: 0   [START ON 01/19/2021] amphetamine-dextroamphetamine (ADDERALL) 15 MG tablet, Take 1 tablet by mouth daily., Disp: 60 tablet, Rfl: 0   [START ON 02/19/2021] amphetamine-dextroamphetamine (ADDERALL) 15 MG tablet, Take 1 tablet by mouth daily., Disp: 60 tablet, Rfl: 0   diclofenac Sodium (VOLTAREN) 1 % GEL, Apply 4 g topically 4 (four) times daily. To affected joint., Disp: 100 g, Rfl: 1   escitalopram (LEXAPRO) 20 MG tablet, Take 1.5 tablets (30 mg total) by mouth daily., Disp: 135 tablet, Rfl: 3   omeprazole (PRILOSEC) 40 MG capsule, Take 1 capsule (40 mg total) by mouth daily., Disp: 90 capsule, Rfl: 1   Medications ordered in this encounter:  No orders of the defined types were placed in this encounter.    *If you need refills on other medications prior to your next appointment, please contact your pharmacy*  Follow-Up: Call back or seek an in-person evaluation if the symptoms worsen or if the condition fails to improve as anticipated.  Other Instructions Remember to take the COVID test as directed. Let us know if positive this time.  If negative:  Take antibiotic (Doxycycline) as directed.  Increase fluids.  Get plenty of rest. Use Mucinex for congestion. Use Tessalon as directed. Take a  daily probiotic (I recommend Align or Culturelle, but even Activia Yogurt may be beneficial).  A humidifier placed in the bedroom may offer some relief for a dry, scratchy throat of nasal irritation.  Read information below on acute bronchitis. Please call or return to clinic if symptoms are not improving.  Acute Bronchitis Bronchitis is when the airways that extend from the windpipe into the lungs get red, puffy, and painful (inflamed). Bronchitis often causes thick spit (mucus) to develop. This leads to a cough. A cough is the most common symptom of bronchitis. In acute bronchitis, the condition usually begins suddenly and goes away over time (usually in 2 weeks). Smoking, allergies, and asthma can make bronchitis worse. Repeated episodes of bronchitis may cause more lung problems.  HOME CARE Rest. Drink enough fluids to keep your pee (urine) clear or pale yellow (unless you need to limit fluids as told by your doctor). Only take over-the-counter or prescription medicines as told by your doctor. Avoid smoking and secondhand smoke. These can make bronchitis worse. If you are a smoker, think about using nicotine gum or skin patches. Quitting smoking will help your lungs heal faster. Reduce the chance of getting bronchitis again by: Washing your hands often. Avoiding people with cold symptoms. Trying not to touch your hands to your mouth, nose, or eyes. Follow up with your doctor as told.  GET HELP IF: Your symptoms do not improve after 1 week of treatment. Symptoms include: Cough. Fever. Coughing up thick spit. Body aches. Chest congestion. Chills. Shortness of breath. Sore throat.  GET HELP RIGHT AWAY IF:  You have an increased fever. You have chills. You have severe shortness of breath. You have bloody thick spit (sputum). You throw up (vomit) often. You lose too much body fluid (dehydration). You have a severe headache. You faint.  MAKE SURE YOU:  Understand these  instructions. Will watch your condition. Will get help right away if you are not doing well or get worse. Document Released: 11/19/2007 Document Revised: 02/02/2013 Document Reviewed: 11/23/2012 [facility] Patient Information 2015 [place], [place]. This information is not intended to replace advice given to you by your health care provider. Make sure you discuss any questions you have with your health care provider.    If you have been instructed to have an in-person evaluation today at a local Urgent Care facility, please use the link below. It will take you to a list of all of our available [facility], including address, phone number and hours of operation. Please do not delay care.  [facility]  If you or a family member do not have a primary care provider, use the link below to schedule a visit and establish care. When you choose a [facility] primary care physician or advanced practice provider, you gain a long-term partner in health. Find a Primary Care Provider  Learn more about 's in-office and virtual care options: [facility]
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
## 2021-01-20 ENCOUNTER — Other Ambulatory Visit: Payer: Self-pay | Admitting: Physician Assistant

## 2021-01-20 DIAGNOSIS — F902 Attention-deficit hyperactivity disorder, combined type: Secondary | ICD-10-CM

## 2021-01-21 ENCOUNTER — Other Ambulatory Visit: Payer: Self-pay | Admitting: Physician Assistant

## 2021-01-21 DIAGNOSIS — F902 Attention-deficit hyperactivity disorder, combined type: Secondary | ICD-10-CM

## 2021-01-25 ENCOUNTER — Telehealth: Payer: Medicaid Other | Admitting: Nurse Practitioner

## 2021-01-25 DIAGNOSIS — J40 Bronchitis, not specified as acute or chronic: Secondary | ICD-10-CM

## 2021-01-25 MED ORDER — ALBUTEROL SULFATE HFA 108 (90 BASE) MCG/ACT IN AERS: 2.0000 | INHALATION_SPRAY | Freq: Four times a day (QID) | RESPIRATORY_TRACT | 0 refills | Status: DC | PRN

## 2021-01-25 MED ORDER — PREDNISONE 10 MG (21) PO TBPK: ORAL_TABLET | ORAL | 0 refills | Status: DC

## 2021-01-25 NOTE — Progress Notes (Signed)
We are sorry that you are not feeling well.  Here is how we plan to help!  Based on your presentation I believe you most likely have ongoing bronchitis and is best treated by rest, plenty of fluids and control of the cough.    We will prescribe  Prednisone 10 mg daily for 6 days (see taper instructions below) And an albuterol inhaler   Meds ordered this encounter  Medications   predniSONE (STERAPRED UNI-PAK 21 TAB) 10 MG (21) TBPK tablet    Sig: Take 6 tablets on day one, 5 on day two, 4 on day three, 3 on day four, 2 on day five, and 1 on day six. Take with food.    Dispense:  21 tablet    Refill:  0   albuterol (VENTOLIN HFA) 108 (90 Base) MCG/ACT inhaler    Sig: Inhale 2 puffs into the lungs every 6 (six) hours as needed for wheezing or shortness of breath.    Dispense:  8 g    Refill:  0    In addition you may use continue to use the benzonatate cough medication or an over the counter cough medication like Mucinex.     From your responses in the eVisit questionnaire you describe inflammation in the upper respiratory tract which is causing a significant cough.  This is commonly called Bronchitis and has four common causes:   Allergies Viral Infections Acid Reflux Bacterial Infection Allergies, viruses and acid reflux are treated by controlling symptoms or eliminating the cause. An example might be a cough caused by taking certain blood pressure medications. You stop the cough by changing the medication. Another example might be a cough caused by acid reflux. Controlling the reflux helps control the cough.  USE OF BRONCHODILATOR ("RESCUE") INHALERS: There is a risk from using your bronchodilator too frequently.  The risk is that over-reliance on a medication which only relaxes the muscles surrounding the breathing tubes can reduce the effectiveness of medications prescribed to reduce swelling and congestion of the tubes themselves.  Although you feel brief relief from the  bronchodilator inhaler, your asthma may actually be worsening with the tubes becoming more swollen and filled with mucus.  This can delay other crucial treatments, such as oral steroid medications. If you need to use a bronchodilator inhaler daily, several times per day, you should discuss this with your provider.  There are probably better treatments that could be used to keep your asthma under control.     HOME CARE Only take medications as instructed by your medical team. Complete the entire course of an antibiotic. Drink plenty of fluids and get plenty of rest. Avoid close contacts especially the very young and the elderly Cover your mouth if you cough or cough into your sleeve. Always remember to wash your hands A steam or ultrasonic humidifier can help congestion.   GET HELP RIGHT AWAY IF: You develop worsening fever. You become short of breath You cough up blood. Your symptoms persist after you have completed your treatment plan MAKE SURE YOU  Understand these instructions. Will watch your condition. Will get help right away if you are not doing well or get worse.    Thank you for choosing an e-visit.  Your e-visit answers were reviewed by a board certified advanced clinical practitioner to complete your personal care plan. Depending upon the condition, your plan could have included both over the counter or prescription medications.  Please review your pharmacy choice. Make sure the pharmacy is  open so you can pick up prescription now. If there is a problem, you may contact your provider through [employer] and have the prescription routed to another pharmacy.  Your safety is important to [place]. If you have drug allergies check your prescription carefully.   For the next 24 hours you can use MyChart to ask questions about today's visit, request a non-urgent call back, or ask for a work or school excuse. You will get an email in the next two days asking about your experience. I  hope that your e-visit has been valuable and will speed your recovery.   I spent approximately 10 minutes reviewing the patient's history, current symptoms and coordinating their care today.

## 2021-01-31 ENCOUNTER — Other Ambulatory Visit: Payer: Self-pay

## 2021-01-31 ENCOUNTER — Ambulatory Visit
Admission: RE | Admit: 2021-01-31 | Discharge: 2021-01-31 | Disposition: A | Discharge status: Home / Self Care | Payer: Medicaid Other | Source: Ambulatory Visit | Attending: Physician Assistant | Admitting: Physician Assistant

## 2021-01-31 DIAGNOSIS — Z803 Family history of malignant neoplasm of breast: Secondary | ICD-10-CM

## 2021-01-31 DIAGNOSIS — N6310 Unspecified lump in the right breast, unspecified quadrant: Secondary | ICD-10-CM

## 2021-01-31 DIAGNOSIS — R922 Inconclusive mammogram: Secondary | ICD-10-CM | POA: Diagnosis not present

## 2021-01-31 NOTE — Progress Notes (Signed)
Normal mammogram

## 2021-02-05 ENCOUNTER — Ambulatory Visit (INDEPENDENT_AMBULATORY_CARE_PROVIDER_SITE_OTHER): Payer: Medicaid Other | Admitting: Physician Assistant

## 2021-02-05 ENCOUNTER — Other Ambulatory Visit: Payer: Self-pay

## 2021-02-05 ENCOUNTER — Encounter: Payer: Self-pay | Admitting: Physician Assistant

## 2021-02-05 VITALS — BP 136/82 | HR 115 | Ht 62.0 in | Wt 174.0 lb

## 2021-02-05 DIAGNOSIS — L0591 Pilonidal cyst without abscess: Secondary | ICD-10-CM | POA: Diagnosis not present

## 2021-02-05 MED ORDER — DOXYCYCLINE HYCLATE 100 MG PO TABS: 100.0000 mg | ORAL_TABLET | Freq: Two times a day (BID) | ORAL | 0 refills | Status: DC

## 2021-02-05 MED ORDER — METHYLPREDNISOLONE SODIUM SUCC 125 MG IJ SOLR
125.0000 mg | Freq: Once | INTRAMUSCULAR | Status: AC
Start: 2021-02-05 — End: 2021-02-05
  Administered 2021-02-05: 125 mg via INTRAMUSCULAR

## 2021-02-05 MED ORDER — IBUPROFEN 800 MG PO TABS: 800.0000 mg | ORAL_TABLET | Freq: Three times a day (TID) | ORAL | 0 refills | Status: DC | PRN

## 2021-02-05 MED ORDER — SODIUM CHLORIDE 0.9 % IV SOLN: 125.0000 mg | Freq: Once | INTRAVENOUS | Status: DC
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
## 2021-02-05 NOTE — Patient Instructions (Addendum)
Pilonidal Cyst  A pilonidal cyst is a fluid-filled sac that forms beneath the skin near the tailbone, at the top of the crease of the buttocks (pilonidal area). If the cyst is not large and not infected, it may not cause any problems. If the cyst becomes irritated or infected, it may get larger and fill with pus. An infected cyst is called an abscess. A pilonidal abscess may cause pain andswelling, and it may need to be drained or removed. What are the causes? The cause of this condition is not always known. In some cases, a hair that grows into your skin (ingrown hair) may be the cause. What increases the risk? You are more likely to get a pilonidal cyst if you: Are female. Have lots of hair near the crease of the buttocks. Are overweight. Have a dimple near the crease of the buttocks. Wear tight clothing. Do not bathe or shower often. Sit for long periods of time. What are the signs or symptoms? Signs and symptoms of a pilonidal cyst may include pain, swelling, redness, and warmth in the pilonidal area. Depending on how big the cyst is, you may be able to feel a lump near your tailbone. If your cyst becomes infected, symptoms may include: Pus or fluid drainage. Fever. Pain, swelling, and redness getting worse. The lump getting bigger. How is this diagnosed? This condition may be diagnosed based on: Your symptoms and medical history. A physical exam. A blood test to check for infection. Testing a pus sample, if applicable. How is this treated? If your cyst does not cause symptoms, you may not need any treatment. If your cyst bothers you or is infected, you may need a procedure to drain or remove the cyst. Depending on the size, location, and severity of your cyst, your health care provider may: Make an incision in the cyst and drain it (incision and drainage). Open and drain the cyst, and then stitch the wound so that it stays open while it heals (marsupialization). You will be given  instructions about how to care for your open wound while it heals. Remove all or part of the cyst, and then close the wound (cyst removal). You may need to take antibiotic medicines before your procedure. Follow these instructions at home: Medicines Take over-the-counter and prescription medicines only as told by your health care provider. If you were prescribed an antibiotic medicine, take it as told by your health care provider. Do not stop taking the antibiotic even if you start to feel better. General instructions Keep the area around your pilonidal cyst clean and dry. If there is fluid or pus draining from your cyst: Cover the area with a clean bandage (dressing) as needed. Wash the area gently with soap and water. Pat the area dry with a clean towel. Do not rub the area because that may cause bleeding. Remove hair from the area around the cyst only if your health care provider tells you to do this. Do not wear tight pants or sit in one position for long periods at a time. Keep all follow-up visits as told by your health care provider. This is important. Contact a health care provider if you have: New redness, swelling, or pain. A fever. Severe pain. Summary A pilonidal cyst is a fluid-filled sac that forms beneath the skin near the tailbone, at the top of the crease of the buttocks (pilonidal area). If the cyst becomes irritated or infected, it may get larger and fill with pus. An infected  cyst is called an abscess. The cause of this condition is not always known. In some cases, a hair that grows into your skin (ingrown hair) may be the cause. If your cyst does not cause symptoms, you may not need any treatment. If your cyst bothers you or is infected, you may need a procedure to drain or remove the cyst. This information is not intended to replace advice given to you by your health care provider. Make sure you discuss any questions you have with your healthcare provider. Document  Revised: 04/12/2020 Document Reviewed: 04/12/2020 Elsevier Patient Education  2022 [employer].   Pilonidal Cyst Drainage, Care After This sheet gives you information about how to care for yourself after your procedure. Your health care provider may also give you more specific instructions. If you have problems or questions, contact your health careprovider. What can I expect after the procedure? After the procedure, it is common to have: Pain that gets better when you take medicine. Some fluid or blood coming from your wound. Follow these instructions at home: Medicines Take over-the-counter and prescription medicines only as told by your health care provider. If you were prescribed an antibiotic medicine, take it as told by your health care provider. Do not stop taking the antibiotic even if you start to feel better. Lifestyle Do not do activities that irritate or put pressure on your buttocks for about 2 weeks, or as long as told by your health care provider. These activities include bike riding, running, and anything that involves a twisting motion. Do not sit for long periods at a time without getting up to move around. Sleep on your side instead of your back. Avoid wearing tight underwear and tight pants. Bathing Do not take baths or showers, swim, or use a hot tub until your health care provider approves. This depends on the type of wound you have from surgery. While bathing, clean your buttocks area gently with soap and water. After bathing: Pat the area dry with a soft, clean towel. Cover the area with a clean bandage (dressing), if told to by your health care provider. General instructions  If you are taking prescription pain medicine, take actions to prevent or treat constipation. Your health care provider may recommend that you: Drink enough fluid to keep your urine pale yellow. Eat foods that are high in fiber, such as fresh fruits and vegetables, whole grains, and  beans. Limit foods that are high in fat and processed sugars, such as fried or sweet foods. Take an over-the-counter or prescription medicine for constipation. You will need to have a caregiver help you manage wound care and dressing changes. Your caregiver should: Wash his or her hands with soap and water before changing your dressing. If soap and water are not available, your caregiver should use hand sanitizer. Check your wound every day for signs of infection, such as: Redness, swelling, or more pain. More fluid or blood. Warmth. Pus or a bad smell. Follow any additional instructions from your health care provider on how to care for your wound, such as wound cleaning, wound flushing (irrigation), or packing your wound with a dressing. Keep all follow-up visits as told by your health care provider. This is important.  If you had incision and drainage with wound packing: Return to your health care provider as instructed to have your packing material changed or removed. Keep the area dry until your packing has been removed. After the packing has been removed, you may start taking showers.  If you had marsupialization: You may start taking showers the day after surgery, or when your health care provider approves. Remove your dressing before you shower, but let the water from the shower moisten your dressing before you remove it. This will make it easier to remove. Ask your health care provider when you can stop using a dressing. If you had incision and drainage without wound packing: Change your dressing as directed. Leave stitches (sutures), skin glue, or adhesive strips in place. These skin closures may need to stay in place for 2 weeks or longer. If adhesive strip edges start to loosen and curl up, you may trim the loose edges. Do not remove adhesive strips completely unless your health care provider tells you to do that. Contact a health care provider if: You have redness, swelling, or  more pain around your wound. You have more fluid or blood coming from your wound. You have new bleeding from your wound. Your wound feels warm to the touch. There is pus or a bad smell coming from your wound. You have pain that does not get better with medicine. You have a fever or chills. You have muscle aches. You are dizzy. You feel generally sick. Summary After a procedure to drain a pilonidal cyst, it is common to have some fluid or blood coming from your wound. If you were prescribed an antibiotic medicine, take it as told by your health care provider. Do not stop taking the antibiotic even if you start to feel better. Return to your health care provider as instructed to have any packing material changed or removed. This information is not intended to replace advice given to you by your health care provider. Make sure you discuss any questions you have with your healthcare provider. Document Revised: 04/12/2020 Document Reviewed: 04/12/2020 Elsevier Patient Education  2022 [employer].

## 2021-02-07 ENCOUNTER — Encounter: Payer: Self-pay | Admitting: Physician Assistant

## 2021-02-08 ENCOUNTER — Encounter: Payer: Self-pay | Admitting: Physician Assistant

## 2021-02-20 ENCOUNTER — Other Ambulatory Visit: Payer: Self-pay | Admitting: Neurology

## 2021-02-20 ENCOUNTER — Encounter: Payer: Self-pay | Admitting: Physician Assistant

## 2021-02-20 ENCOUNTER — Other Ambulatory Visit: Payer: Self-pay | Admitting: Physician Assistant

## 2021-02-20 DIAGNOSIS — F902 Attention-deficit hyperactivity disorder, combined type: Secondary | ICD-10-CM

## 2021-02-20 MED ORDER — AMPHETAMINE-DEXTROAMPHETAMINE 15 MG PO TABS: 15.0000 mg | ORAL_TABLET | Freq: Two times a day (BID) | ORAL | 0 refills | Status: DC

## 2021-02-20 NOTE — Telephone Encounter (Signed)
Patient called stating she picked up her Adderall but this was for once daily instead of twice a day, which she has taken for years.   This does appear to be an error, the amount was correct but the directions were wrong. Can you sign the updated RX for patient?

## 2021-02-21 ENCOUNTER — Other Ambulatory Visit: Payer: Self-pay | Admitting: Medical-Surgical

## 2021-02-21 DIAGNOSIS — F902 Attention-deficit hyperactivity disorder, combined type: Secondary | ICD-10-CM

## 2021-03-06 ENCOUNTER — Encounter: Payer: Self-pay | Admitting: Physician Assistant

## 2021-03-06 DIAGNOSIS — L0591 Pilonidal cyst without abscess: Secondary | ICD-10-CM

## 2021-03-07 ENCOUNTER — Encounter: Payer: Self-pay | Admitting: Emergency Medicine

## 2021-03-07 ENCOUNTER — Emergency Department (INDEPENDENT_AMBULATORY_CARE_PROVIDER_SITE_OTHER)
Admission: EM | Admit: 2021-03-07 | Discharge: 2021-03-07 | Disposition: A | Discharge status: Home / Self Care | Payer: Medicaid Other | Source: Home / Self Care

## 2021-03-07 ENCOUNTER — Other Ambulatory Visit: Payer: Self-pay

## 2021-03-07 DIAGNOSIS — L0501 Pilonidal cyst with abscess: Secondary | ICD-10-CM | POA: Diagnosis not present

## 2021-03-07 MED ORDER — AMOXICILLIN-POT CLAVULANATE 875-125 MG PO TABS: 1.0000 | ORAL_TABLET | Freq: Two times a day (BID) | ORAL | 0 refills | Status: DC

## 2021-03-07 MED ORDER — HYDROCODONE-ACETAMINOPHEN 5-325 MG PO TABS: 1.0000 | ORAL_TABLET | Freq: Four times a day (QID) | ORAL | 0 refills | Status: DC | PRN

## 2021-03-07 NOTE — Discharge Instructions (Signed)
Leave dressing on until tomorrow After this change daily and apply bandaid Take the antibiotic 2 x a day Take a probiotic with the medicine to prevent diarrhea The take ibuprofen, Aleve, or Tylenol for pain I have prescribed hydrocodone for severe pain if needed I will message your primary care doctor regarding a surgical referral.  Call Monday to make sure it was placed, then you may make an appointment

## 2021-03-07 NOTE — ED Triage Notes (Signed)
Recurrent pilonidal cyst- no surgical consult  Unable to see PCP today  Pain & swelling has resumed as of 2 days ago  Completed doxy & steroids in august
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
## 2021-03-22 ENCOUNTER — Ambulatory Visit (INDEPENDENT_AMBULATORY_CARE_PROVIDER_SITE_OTHER): Payer: Self-pay | Admitting: Physician Assistant

## 2021-03-22 DIAGNOSIS — Z91199 Patient's noncompliance with other medical treatment and regimen due to unspecified reason: Secondary | ICD-10-CM

## 2021-03-22 NOTE — Progress Notes (Signed)
No show

## 2021-03-25 ENCOUNTER — Telehealth: Payer: Self-pay

## 2021-03-25 NOTE — Telephone Encounter (Signed)
Medication: amphetamine-dextroamphetamine (ADDERALL) 15 MG tablet Prior authorization submitted via CoverMyMeds on 03/25/2021 PA submission: PA not requried

## 2021-04-08 ENCOUNTER — Emergency Department (INDEPENDENT_AMBULATORY_CARE_PROVIDER_SITE_OTHER)
Admission: EM | Admit: 2021-04-08 | Discharge: 2021-04-08 | Disposition: A | Discharge status: Home / Self Care | Payer: Medicaid Other | Source: Home / Self Care | Attending: Family Medicine | Admitting: Family Medicine

## 2021-04-08 ENCOUNTER — Other Ambulatory Visit: Payer: Self-pay

## 2021-04-08 DIAGNOSIS — R059 Cough, unspecified: Secondary | ICD-10-CM | POA: Diagnosis not present

## 2021-04-08 DIAGNOSIS — J069 Acute upper respiratory infection, unspecified: Secondary | ICD-10-CM

## 2021-04-08 LAB — POC INFLUENZA A AND B ANTIGEN (URGENT CARE ONLY)
Influenza A Ag: NEGATIVE
Influenza B Ag: NEGATIVE

## 2021-04-08 LAB — POC SARS CORONAVIRUS 2 AG -  ED: SARS Coronavirus 2 Ag: NEGATIVE

## 2021-04-08 MED ORDER — DOXYCYCLINE HYCLATE 100 MG PO CAPS
ORAL_CAPSULE | ORAL | 0 refills | Status: DC
Start: 2021-04-08 — End: 2021-05-15

## 2021-04-08 NOTE — ED Triage Notes (Signed)
Pt c/o sinus congestion, cough, and sore throat since yesterday. Post nasal drip and bilateral ear congestion worse today. Alkaseltzer cold & flu prn.
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
## 2021-04-08 NOTE — Discharge Instructions (Signed)
Take plain guaifenesin (1200mg  extended release tabs such as Mucinex) twice daily, with plenty of water, for cough and congestion.  May add Pseudoephedrine (30mg , one every 4 to 6 hours) for sinus congestion.  Get adequate rest.   May use Afrin nasal spray (or generic oxymetazoline) each morning for about 5 days and then discontinue.  Also recommend using saline nasal spray several times daily and saline nasal irrigation (AYR is a common brand).  Use Flonase nasal spray each morning after using Afrin nasal spray and saline nasal irrigation. Try warm salt water gargles for sore throat.  Stop all antihistamines for now, and other non-prescription cough/cold preparations. Begin doxycycline if not improving about one week or if persistent fever develops

## 2021-04-09 LAB — SARS-COV-2 RNA,(COVID-19) QUALITATIVE NAAT: SARS CoV2 RNA: NOT DETECTED

## 2021-04-29 ENCOUNTER — Other Ambulatory Visit: Payer: Self-pay | Admitting: Medical-Surgical

## 2021-04-29 DIAGNOSIS — F902 Attention-deficit hyperactivity disorder, combined type: Secondary | ICD-10-CM

## 2021-04-30 MED ORDER — AMPHETAMINE-DEXTROAMPHETAMINE 15 MG PO TABS: 15.0000 mg | ORAL_TABLET | Freq: Two times a day (BID) | ORAL | 0 refills | Status: DC

## 2021-05-15 ENCOUNTER — Emergency Department (INDEPENDENT_AMBULATORY_CARE_PROVIDER_SITE_OTHER)
Admission: EM | Admit: 2021-05-15 | Discharge: 2021-05-15 | Disposition: A | Discharge status: Home / Self Care | Payer: Medicaid Other | Source: Home / Self Care | Attending: Family Medicine | Admitting: Family Medicine

## 2021-05-15 ENCOUNTER — Encounter: Payer: Self-pay | Admitting: Emergency Medicine

## 2021-05-15 DIAGNOSIS — R200 Anesthesia of skin: Secondary | ICD-10-CM | POA: Diagnosis not present

## 2021-05-15 DIAGNOSIS — R202 Paresthesia of skin: Secondary | ICD-10-CM | POA: Diagnosis not present

## 2021-05-15 MED ORDER — METHYLPREDNISOLONE 4 MG PO TBPK: ORAL_TABLET | ORAL | 0 refills | Status: DC

## 2021-05-15 MED ORDER — TIZANIDINE HCL 4 MG PO TABS: 4.0000 mg | ORAL_TABLET | Freq: Four times a day (QID) | ORAL | 0 refills | Status: DC | PRN

## 2021-05-15 NOTE — Discharge Instructions (Signed)
Try ice to painful neck area Take tizanidine 1 or 2 at bedtime Take the Dosepak as directed.  Take all of day 1 today See your primary care doctor if this numbness persists.  You might need additional work-up
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
## 2021-05-15 NOTE — ED Triage Notes (Signed)
R arm pain history  Today at work it went numb & tingly Has stayed that way for 2 hours now Hx of back pain (cervical region) Works in a [employer]

## 2021-06-06 ENCOUNTER — Other Ambulatory Visit: Payer: Self-pay

## 2021-06-06 DIAGNOSIS — F902 Attention-deficit hyperactivity disorder, combined type: Secondary | ICD-10-CM

## 2021-06-06 NOTE — Telephone Encounter (Signed)
Patricia Potter called and left a message for a refill of Adderall sent to [facility].   She no-showed her last follow up appointment.

## 2021-06-07 MED ORDER — AMPHETAMINE-DEXTROAMPHETAMINE 15 MG PO TABS: 15.0000 mg | ORAL_TABLET | Freq: Two times a day (BID) | ORAL | 0 refills | Status: DC

## 2021-06-07 NOTE — Telephone Encounter (Signed)
She will need OV before any more are refilled after this rx.

## 2021-06-07 NOTE — Telephone Encounter (Signed)
Patient advised.

## 2021-06-11 ENCOUNTER — Emergency Department
Admission: EM | Admit: 2021-06-11 | Discharge: 2021-06-11 | Disposition: A | Discharge status: Home / Self Care | Payer: Medicaid Other | Source: Home / Self Care | Attending: Family Medicine | Admitting: Family Medicine

## 2021-06-11 ENCOUNTER — Emergency Department (INDEPENDENT_AMBULATORY_CARE_PROVIDER_SITE_OTHER): Payer: Medicaid Other

## 2021-06-11 ENCOUNTER — Other Ambulatory Visit: Payer: Self-pay

## 2021-06-11 DIAGNOSIS — S99921A Unspecified injury of right foot, initial encounter: Secondary | ICD-10-CM | POA: Diagnosis not present

## 2021-06-11 DIAGNOSIS — S9031XA Contusion of right foot, initial encounter: Secondary | ICD-10-CM

## 2021-06-11 MED ORDER — IBUPROFEN 800 MG PO TABS: 800.0000 mg | ORAL_TABLET | Freq: Three times a day (TID) | ORAL | 0 refills | Status: DC
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
## 2021-06-11 NOTE — Discharge Instructions (Addendum)
Advised/informed patient of right foot x-ray results today.  Advised patient to RICE right foot for 25 minutes 3 times daily for the next 2 to 3 days.  Advised may use Ibuprofen 800 mg 1-2 times daily, as needed for right foot pain.  Encouraged patient to increase daily water intake while taking this medication.  Encouraged patient to stay off her right foot is much as possible for the next 2 to 3 days to allow sufficient healing.
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
## 2021-07-05 ENCOUNTER — Ambulatory Visit (INDEPENDENT_AMBULATORY_CARE_PROVIDER_SITE_OTHER): Payer: Medicaid Other | Admitting: Physician Assistant

## 2021-07-05 ENCOUNTER — Encounter: Payer: Self-pay | Admitting: Physician Assistant

## 2021-07-05 ENCOUNTER — Other Ambulatory Visit: Payer: Self-pay

## 2021-07-05 VITALS — BP 138/98 | HR 111 | Ht 62.0 in | Wt 178.0 lb

## 2021-07-05 DIAGNOSIS — F902 Attention-deficit hyperactivity disorder, combined type: Secondary | ICD-10-CM

## 2021-07-05 DIAGNOSIS — R030 Elevated blood-pressure reading, without diagnosis of hypertension: Secondary | ICD-10-CM

## 2021-07-05 DIAGNOSIS — F339 Major depressive disorder, recurrent, unspecified: Secondary | ICD-10-CM

## 2021-07-05 DIAGNOSIS — F419 Anxiety disorder, unspecified: Secondary | ICD-10-CM | POA: Diagnosis not present

## 2021-07-05 MED ORDER — AMPHETAMINE-DEXTROAMPHETAMINE 15 MG PO TABS: 15.0000 mg | ORAL_TABLET | Freq: Two times a day (BID) | ORAL | 0 refills | Status: DC
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
## 2021-07-08 ENCOUNTER — Encounter: Payer: Self-pay | Admitting: Physician Assistant

## 2021-07-08 DIAGNOSIS — R030 Elevated blood-pressure reading, without diagnosis of hypertension: Secondary | ICD-10-CM | POA: Insufficient documentation

## 2021-07-10 ENCOUNTER — Other Ambulatory Visit: Payer: Self-pay | Admitting: Physician Assistant

## 2021-07-10 DIAGNOSIS — F419 Anxiety disorder, unspecified: Secondary | ICD-10-CM

## 2021-07-10 DIAGNOSIS — F902 Attention-deficit hyperactivity disorder, combined type: Secondary | ICD-10-CM

## 2021-07-10 DIAGNOSIS — F339 Major depressive disorder, recurrent, unspecified: Secondary | ICD-10-CM

## 2021-07-10 NOTE — Progress Notes (Signed)
New referral sent.

## 2021-08-02 ENCOUNTER — Other Ambulatory Visit: Payer: Self-pay | Admitting: Physician Assistant

## 2021-08-02 DIAGNOSIS — F902 Attention-deficit hyperactivity disorder, combined type: Secondary | ICD-10-CM

## 2021-08-30 ENCOUNTER — Other Ambulatory Visit: Payer: Self-pay | Admitting: Physician Assistant

## 2021-08-30 DIAGNOSIS — F902 Attention-deficit hyperactivity disorder, combined type: Secondary | ICD-10-CM

## 2021-09-03 ENCOUNTER — Other Ambulatory Visit: Payer: Self-pay | Admitting: Physician Assistant

## 2021-09-03 DIAGNOSIS — F902 Attention-deficit hyperactivity disorder, combined type: Secondary | ICD-10-CM

## 2021-09-03 MED ORDER — AMPHETAMINE-DEXTROAMPHETAMINE 15 MG PO TABS: 15.0000 mg | ORAL_TABLET | Freq: Two times a day (BID) | ORAL | 0 refills | Status: DC

## 2021-09-03 NOTE — Telephone Encounter (Signed)
RX written for 08/31/2021 to [facility].  ?

## 2021-09-03 NOTE — Telephone Encounter (Signed)
Cancelled RX from [place]. [facility] per patient request. RX pended.  ?

## 2021-09-03 NOTE — Telephone Encounter (Signed)
Patient called to see if her prescription for Adderall can be sent to the below pharmacy ? [facility] ?[place] [address] ? ? ?

## 2021-09-30 ENCOUNTER — Other Ambulatory Visit: Payer: Self-pay | Admitting: Physician Assistant

## 2021-09-30 DIAGNOSIS — F902 Attention-deficit hyperactivity disorder, combined type: Secondary | ICD-10-CM

## 2021-10-04 ENCOUNTER — Ambulatory Visit: Payer: Medicaid Other | Admitting: Physician Assistant

## 2021-10-04 DIAGNOSIS — F902 Attention-deficit hyperactivity disorder, combined type: Secondary | ICD-10-CM

## 2021-10-30 ENCOUNTER — Ambulatory Visit (INDEPENDENT_AMBULATORY_CARE_PROVIDER_SITE_OTHER): Payer: Medicaid Other | Admitting: Physician Assistant

## 2021-10-30 ENCOUNTER — Encounter: Payer: Self-pay | Admitting: Physician Assistant

## 2021-10-30 VITALS — BP 121/82 | HR 88 | Ht 62.0 in | Wt 177.0 lb

## 2021-10-30 DIAGNOSIS — L0591 Pilonidal cyst without abscess: Secondary | ICD-10-CM | POA: Diagnosis not present

## 2021-10-30 DIAGNOSIS — F902 Attention-deficit hyperactivity disorder, combined type: Secondary | ICD-10-CM

## 2021-10-30 DIAGNOSIS — F419 Anxiety disorder, unspecified: Secondary | ICD-10-CM | POA: Diagnosis not present

## 2021-10-30 DIAGNOSIS — F339 Major depressive disorder, recurrent, unspecified: Secondary | ICD-10-CM | POA: Diagnosis not present

## 2021-10-30 MED ORDER — SERTRALINE HCL 50 MG PO TABS: 50.0000 mg | ORAL_TABLET | Freq: Every day | ORAL | 2 refills | Status: DC

## 2021-10-30 MED ORDER — AMPHETAMINE-DEXTROAMPHETAMINE 15 MG PO TABS: 15.0000 mg | ORAL_TABLET | Freq: Two times a day (BID) | ORAL | 0 refills | Status: DC

## 2021-10-30 MED ORDER — DOXYCYCLINE HYCLATE 100 MG PO TABS: 100.0000 mg | ORAL_TABLET | Freq: Two times a day (BID) | ORAL | 0 refills | Status: DC

## 2021-10-30 NOTE — Patient Instructions (Signed)
Counseling  ?Zoloft 1/2 tablet for 7 days then one full tablet ? ? ?
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
## 2021-11-28 ENCOUNTER — Other Ambulatory Visit: Payer: Self-pay | Admitting: Physician Assistant

## 2021-11-28 DIAGNOSIS — F902 Attention-deficit hyperactivity disorder, combined type: Secondary | ICD-10-CM

## 2021-11-29 NOTE — Telephone Encounter (Signed)
Can you see why the pharmacy does not have this? Sent 3 post dated rx.

## 2021-12-09 ENCOUNTER — Other Ambulatory Visit: Payer: Self-pay | Admitting: Physician Assistant

## 2021-12-09 DIAGNOSIS — F902 Attention-deficit hyperactivity disorder, combined type: Secondary | ICD-10-CM

## 2021-12-09 MED ORDER — AMPHETAMINE-DEXTROAMPHETAMINE 15 MG PO TABS: 15.0000 mg | ORAL_TABLET | Freq: Two times a day (BID) | ORAL | 0 refills | Status: DC

## 2021-12-09 NOTE — Telephone Encounter (Signed)
Patient called in to get a refill on her adderall and it need to be sent to     [facility] [address] [place] [address]

## 2021-12-09 NOTE — Telephone Encounter (Signed)
This was [facility] to [facility].  Pended for new pharmacy. Sign if appropriate.

## 2021-12-11 ENCOUNTER — Telehealth: Payer: Medicaid Other | Admitting: Physician Assistant

## 2021-12-11 DIAGNOSIS — M5030 Other cervical disc degeneration, unspecified cervical region: Secondary | ICD-10-CM

## 2021-12-11 MED ORDER — CYCLOBENZAPRINE HCL 10 MG PO TABS
5.0000 mg | ORAL_TABLET | Freq: Three times a day (TID) | ORAL | 0 refills | Status: DC | PRN
Start: 2021-12-11 — End: 2022-02-07

## 2021-12-11 MED ORDER — NAPROXEN 500 MG PO TABS
500.0000 mg | ORAL_TABLET | Freq: Two times a day (BID) | ORAL | 0 refills | Status: DC
Start: 2021-12-11 — End: 2022-01-01

## 2021-12-11 NOTE — Progress Notes (Signed)

## 2021-12-16 DIAGNOSIS — M5412 Radiculopathy, cervical region: Secondary | ICD-10-CM | POA: Diagnosis not present

## 2021-12-16 DIAGNOSIS — M542 Cervicalgia: Secondary | ICD-10-CM | POA: Diagnosis not present

## 2021-12-31 DIAGNOSIS — M542 Cervicalgia: Secondary | ICD-10-CM | POA: Diagnosis not present

## 2022-01-01 ENCOUNTER — Other Ambulatory Visit: Payer: Self-pay

## 2022-01-01 ENCOUNTER — Emergency Department
Admission: RE | Admit: 2022-01-01 | Discharge: 2022-01-01 | Disposition: A | Discharge status: Home / Self Care | Payer: Medicaid Other | Source: Ambulatory Visit

## 2022-01-01 VITALS — BP 124/87 | HR 109 | Temp 98.8°F | Resp 16 | Ht 62.0 in | Wt 165.0 lb

## 2022-01-01 DIAGNOSIS — S91302A Unspecified open wound, left foot, initial encounter: Secondary | ICD-10-CM

## 2022-01-01 NOTE — Discharge Instructions (Addendum)
Advised patient to follow-up with [facility] for further evaluation of existing skin avulsions of left forefoot.  Advised patient to call to make appointment with this provider today contact information is below.

## 2022-01-01 NOTE — ED Triage Notes (Signed)
Left foot injury Stepped on sidewalk last night and split bottom of foot
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
## 2022-01-03 ENCOUNTER — Ambulatory Visit: Payer: Medicaid Other | Admitting: Podiatry

## 2022-01-03 ENCOUNTER — Encounter: Payer: Self-pay | Admitting: Podiatry

## 2022-01-03 DIAGNOSIS — L853 Xerosis cutis: Secondary | ICD-10-CM

## 2022-01-03 DIAGNOSIS — L84 Corns and callosities: Secondary | ICD-10-CM | POA: Diagnosis not present

## 2022-01-03 NOTE — Progress Notes (Signed)
  Subjective:  Patient ID: Patricia Potter, female    DOB: May 04, 1986,   MRN: [id number]  Chief Complaint  Patient presents with   lesion    L ft tears in the bottom of her feet that are bleeding     36 y.o. female presents for concern of lesions on the bottom of her feet that are bleeding. Has had the lesions for years bilateral and they have been painful recently she was playing baseball and they split open and have been bleeding . She is not a diabetic.  Marland Kitchen Denies any other pedal complaints. Denies n/v/f/c.   Past Medical History:  Diagnosis Date   COVID-19 03/2020   pt has had COVID x 3 - no vaccine   Papillary carcinoma ([place]) 2015    Objective:  Physical Exam: Vascular: DP/PT pulses 2/4 bilateral. CFT <3 seconds. Normal hair growth on digits. No edema.  Skin. No lacerations or abrasions bilateral feet. Hyperkeratotic cracked lesions noted to ball of bilateral feel and heels.  Musculoskeletal: MMT 5/5 bilateral lower extremities in DF, PF, Inversion and Eversion. Deceased ROM in DF of ankle joint.  Neurological: Sensation intact to light touch.   Assessment:   1. Dry skin   2. Callus of foot      Plan:  Patient was evaluated and treated and all questions answered. -Discussed corns and calluses with patient and treatment options.  -Encouraged daily moisturizing -Discussed use of pumice stone -Dispensed padding to help offload areas due to pain.  -Advised good supportive shoes and inserts -Patient to return to office as needed or sooner if condition worsens.   Lorenda Peck, DPM

## 2022-01-13 DIAGNOSIS — M5412 Radiculopathy, cervical region: Secondary | ICD-10-CM | POA: Diagnosis not present

## 2022-01-14 DIAGNOSIS — M5412 Radiculopathy, cervical region: Secondary | ICD-10-CM | POA: Diagnosis not present

## 2022-01-14 DIAGNOSIS — F17210 Nicotine dependence, cigarettes, uncomplicated: Secondary | ICD-10-CM | POA: Diagnosis not present

## 2022-01-14 DIAGNOSIS — Z716 Tobacco abuse counseling: Secondary | ICD-10-CM | POA: Diagnosis not present

## 2022-01-16 ENCOUNTER — Ambulatory Visit (INDEPENDENT_AMBULATORY_CARE_PROVIDER_SITE_OTHER): Payer: Medicaid Other | Admitting: Podiatry

## 2022-01-16 DIAGNOSIS — Z91199 Patient's noncompliance with other medical treatment and regimen due to unspecified reason: Secondary | ICD-10-CM

## 2022-01-16 NOTE — Progress Notes (Signed)
No show

## 2022-01-29 DIAGNOSIS — M5412 Radiculopathy, cervical region: Secondary | ICD-10-CM | POA: Diagnosis not present

## 2022-01-31 ENCOUNTER — Ambulatory Visit: Payer: Medicaid Other | Admitting: Physician Assistant

## 2022-01-31 DIAGNOSIS — F902 Attention-deficit hyperactivity disorder, combined type: Secondary | ICD-10-CM

## 2022-02-07 ENCOUNTER — Other Ambulatory Visit: Payer: Self-pay | Admitting: Physician Assistant

## 2022-02-07 DIAGNOSIS — F902 Attention-deficit hyperactivity disorder, combined type: Secondary | ICD-10-CM

## 2022-02-07 DIAGNOSIS — M5030 Other cervical disc degeneration, unspecified cervical region: Secondary | ICD-10-CM

## 2022-02-10 MED ORDER — AMPHETAMINE-DEXTROAMPHETAMINE 15 MG PO TABS: 15.0000 mg | ORAL_TABLET | Freq: Two times a day (BID) | ORAL | 0 refills | Status: DC

## 2022-02-10 MED ORDER — CYCLOBENZAPRINE HCL 10 MG PO TABS: 5.0000 mg | ORAL_TABLET | Freq: Three times a day (TID) | ORAL | 0 refills | Status: DC | PRN

## 2022-02-25 DIAGNOSIS — M542 Cervicalgia: Secondary | ICD-10-CM | POA: Diagnosis not present

## 2022-03-14 ENCOUNTER — Other Ambulatory Visit: Payer: Self-pay | Admitting: Physician Assistant

## 2022-03-14 DIAGNOSIS — F902 Attention-deficit hyperactivity disorder, combined type: Secondary | ICD-10-CM

## 2022-03-14 MED ORDER — AMPHETAMINE-DEXTROAMPHETAMINE 15 MG PO TABS: 15.0000 mg | ORAL_TABLET | Freq: Two times a day (BID) | ORAL | 0 refills | Status: DC

## 2022-03-14 NOTE — Telephone Encounter (Signed)
Patricia Potter sent 15 days to pharmacy. Patient needs to schedule.

## 2022-03-14 NOTE — Telephone Encounter (Signed)
10/30/2021 - last appt   02/10/2022 - last written for one month

## 2022-03-14 NOTE — Telephone Encounter (Signed)
Patient is requesting refills on Addarall and I'm in the process of scheduling her an appointment as well.

## 2022-03-19 ENCOUNTER — Ambulatory Visit: Payer: Medicaid Other | Admitting: Physician Assistant

## 2022-03-19 DIAGNOSIS — Z13220 Encounter for screening for lipoid disorders: Secondary | ICD-10-CM

## 2022-03-19 DIAGNOSIS — E559 Vitamin D deficiency, unspecified: Secondary | ICD-10-CM

## 2022-03-19 DIAGNOSIS — Z1329 Encounter for screening for other suspected endocrine disorder: Secondary | ICD-10-CM

## 2022-03-19 DIAGNOSIS — Z131 Encounter for screening for diabetes mellitus: Secondary | ICD-10-CM

## 2022-03-19 DIAGNOSIS — R790 Abnormal level of blood mineral: Secondary | ICD-10-CM

## 2022-03-19 DIAGNOSIS — E538 Deficiency of other specified B group vitamins: Secondary | ICD-10-CM

## 2022-03-19 DIAGNOSIS — F902 Attention-deficit hyperactivity disorder, combined type: Secondary | ICD-10-CM

## 2022-04-18 ENCOUNTER — Encounter: Payer: Self-pay | Admitting: Physician Assistant

## 2022-04-18 ENCOUNTER — Ambulatory Visit: Payer: Medicaid Other | Admitting: Physician Assistant

## 2022-04-18 VITALS — BP 116/61 | HR 100 | Ht 62.0 in | Wt 195.0 lb

## 2022-04-18 DIAGNOSIS — E6609 Other obesity due to excess calories: Secondary | ICD-10-CM | POA: Diagnosis not present

## 2022-04-18 DIAGNOSIS — C73 Malignant neoplasm of thyroid gland: Secondary | ICD-10-CM | POA: Diagnosis not present

## 2022-04-18 DIAGNOSIS — Z131 Encounter for screening for diabetes mellitus: Secondary | ICD-10-CM | POA: Diagnosis not present

## 2022-04-18 DIAGNOSIS — Z6835 Body mass index (BMI) 35.0-35.9, adult: Secondary | ICD-10-CM

## 2022-04-18 DIAGNOSIS — E559 Vitamin D deficiency, unspecified: Secondary | ICD-10-CM | POA: Diagnosis not present

## 2022-04-18 DIAGNOSIS — E538 Deficiency of other specified B group vitamins: Secondary | ICD-10-CM

## 2022-04-18 DIAGNOSIS — R790 Abnormal level of blood mineral: Secondary | ICD-10-CM

## 2022-04-18 DIAGNOSIS — Z13220 Encounter for screening for lipoid disorders: Secondary | ICD-10-CM | POA: Diagnosis not present

## 2022-04-18 DIAGNOSIS — Z720 Tobacco use: Secondary | ICD-10-CM | POA: Diagnosis not present

## 2022-04-18 DIAGNOSIS — F902 Attention-deficit hyperactivity disorder, combined type: Secondary | ICD-10-CM

## 2022-04-18 DIAGNOSIS — Z23 Encounter for immunization: Secondary | ICD-10-CM | POA: Diagnosis not present

## 2022-04-18 MED ORDER — BUPROPION HCL ER (SR) 100 MG PO TB12: 100.0000 mg | ORAL_TABLET | Freq: Two times a day (BID) | ORAL | 2 refills | Status: DC

## 2022-04-18 MED ORDER — AMPHETAMINE-DEXTROAMPHETAMINE 15 MG PO TABS: 15.0000 mg | ORAL_TABLET | Freq: Two times a day (BID) | ORAL | 0 refills | Status: DC
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
## 2022-04-18 NOTE — Patient Instructions (Signed)
60g  Cut off calories by 8 oclock

## 2022-04-21 ENCOUNTER — Telehealth: Payer: Self-pay | Admitting: Physician Assistant

## 2022-04-21 ENCOUNTER — Encounter: Payer: Self-pay | Admitting: Physician Assistant

## 2022-04-21 MED ORDER — ALPRAZOLAM 0.5 MG PO TABS: 0.5000 mg | ORAL_TABLET | Freq: Every evening | ORAL | 1 refills | Status: DC | PRN

## 2022-04-21 NOTE — Telephone Encounter (Signed)
Patient called requesting Xanax she is having anxiety again and requested a rx be sent into pharmacy

## 2022-04-21 NOTE — Telephone Encounter (Signed)
Call patient And left voicemail for patient to call back clinic

## 2022-04-23 NOTE — Telephone Encounter (Signed)
Call patient and left voicemail to call clinic back.

## 2022-05-02 ENCOUNTER — Other Ambulatory Visit: Payer: Self-pay | Admitting: Physician Assistant

## 2022-05-02 DIAGNOSIS — F902 Attention-deficit hyperactivity disorder, combined type: Secondary | ICD-10-CM

## 2022-05-02 MED ORDER — AMPHETAMINE-DEXTROAMPHETAMINE 15 MG PO TABS: 15.0000 mg | ORAL_TABLET | Freq: Two times a day (BID) | ORAL | 0 refills | Status: DC

## 2022-05-02 NOTE — Telephone Encounter (Signed)
Adderall was sent in this month for 30 pills instead of 60. She did pick this up two weeks ago so can get this now. Can you sign RX please?

## 2022-05-02 NOTE — Telephone Encounter (Signed)
Patient called says she only got a 15 day supply of Adderall and needs a 30 day supply pharmacy denied order per patient she would like a call back

## 2022-05-30 ENCOUNTER — Other Ambulatory Visit: Payer: Self-pay | Admitting: Physician Assistant

## 2022-05-30 DIAGNOSIS — F902 Attention-deficit hyperactivity disorder, combined type: Secondary | ICD-10-CM

## 2022-05-30 NOTE — Telephone Encounter (Signed)
LOV: 04/18/22

## 2022-06-02 ENCOUNTER — Other Ambulatory Visit: Payer: Self-pay | Admitting: Physician Assistant

## 2022-06-02 DIAGNOSIS — F902 Attention-deficit hyperactivity disorder, combined type: Secondary | ICD-10-CM

## 2022-06-02 DIAGNOSIS — M5030 Other cervical disc degeneration, unspecified cervical region: Secondary | ICD-10-CM

## 2022-06-03 MED ORDER — AMPHETAMINE-DEXTROAMPHETAMINE 15 MG PO TABS: 15.0000 mg | ORAL_TABLET | Freq: Two times a day (BID) | ORAL | 0 refills | Status: DC

## 2022-06-03 NOTE — Telephone Encounter (Signed)
..  PDMP reviewed during this encounter. Last fill 11/17  Ok for refill.

## 2022-07-01 ENCOUNTER — Other Ambulatory Visit: Payer: Self-pay | Admitting: Physician Assistant

## 2022-07-01 DIAGNOSIS — F902 Attention-deficit hyperactivity disorder, combined type: Secondary | ICD-10-CM

## 2022-07-29 ENCOUNTER — Other Ambulatory Visit: Payer: Self-pay | Admitting: Physician Assistant

## 2022-07-29 DIAGNOSIS — F902 Attention-deficit hyperactivity disorder, combined type: Secondary | ICD-10-CM

## 2022-07-30 MED ORDER — AMPHETAMINE-DEXTROAMPHETAMINE 15 MG PO TABS: 15.0000 mg | ORAL_TABLET | Freq: Two times a day (BID) | ORAL | 0 refills | Status: DC

## 2022-07-30 NOTE — Telephone Encounter (Signed)
..  PDMP reviewed during this encounter.

## 2022-08-18 ENCOUNTER — Ambulatory Visit: Payer: Medicaid Other | Admitting: Physician Assistant

## 2022-08-26 ENCOUNTER — Other Ambulatory Visit: Payer: Self-pay | Admitting: Physician Assistant

## 2022-08-26 DIAGNOSIS — F902 Attention-deficit hyperactivity disorder, combined type: Secondary | ICD-10-CM

## 2022-08-26 NOTE — Telephone Encounter (Signed)
Patient called requesting refills on Adderall  '15mg'$  she is scheduled for 09-01-22

## 2022-08-26 NOTE — Telephone Encounter (Signed)
Last written 07/30/2022 for one month no refills  Last appt 04/18/2022, no showed appt 08/18/2022

## 2022-08-27 NOTE — Telephone Encounter (Signed)
Needs appt first. I know she has one scheduled for 3/18.

## 2022-08-29 ENCOUNTER — Encounter: Payer: Self-pay | Admitting: Emergency Medicine

## 2022-08-29 ENCOUNTER — Ambulatory Visit
Admission: EM | Admit: 2022-08-29 | Discharge: 2022-08-29 | Disposition: A | Discharge status: Home / Self Care | Payer: Medicaid Other | Attending: Family Medicine | Admitting: Family Medicine

## 2022-08-29 DIAGNOSIS — L0591 Pilonidal cyst without abscess: Secondary | ICD-10-CM | POA: Diagnosis not present

## 2022-08-29 MED ORDER — DOXYCYCLINE HYCLATE 100 MG PO CAPS: 100.0000 mg | ORAL_CAPSULE | Freq: Two times a day (BID) | ORAL | 0 refills | Status: AC
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
## 2022-08-29 NOTE — ED Notes (Signed)
[facility] call ed by this RN to make an appointment. Referral coordinator, Elmo Putt, requested  pt's notes & demographics be faxed to the office & they will reach out to the pt to schedule an appointment. [facility] updated by this RN prior to discharge

## 2022-08-29 NOTE — Discharge Instructions (Addendum)
Instructed patient to take medication as directed with food to completion.  Encouraged patient to increase daily water intake to 64 ounces per day while taking this medication.  If symptoms worsen and or unresolved please follow-up with PCP or [facility], [address], [address], [place], [place] [address] [phone].

## 2022-08-29 NOTE — ED Triage Notes (Signed)
Pt here for a pilonidal cyst- it is reoccurring Red, swollen area on right side at cleft of buttock  Pt states she had one 2 months ago that she treated at home  Has been on antibiotics at least 4 times in the past few years The inflammation came up 3 days ago - epsom  salt bath this am  PT will need a work note

## 2022-08-30 ENCOUNTER — Telehealth: Payer: Self-pay | Admitting: Emergency Medicine

## 2022-08-30 NOTE — Telephone Encounter (Signed)
Call to [facility] to see how she was today & if she had any concerns, no answer. Referral paperwork sent to [facility] on Friday.

## 2022-09-01 ENCOUNTER — Encounter: Payer: Self-pay | Admitting: Physician Assistant

## 2022-09-01 ENCOUNTER — Ambulatory Visit: Payer: Medicaid Other | Admitting: Physician Assistant

## 2022-09-01 VITALS — BP 134/81 | HR 99 | Ht 62.0 in | Wt 198.0 lb

## 2022-09-01 DIAGNOSIS — F902 Attention-deficit hyperactivity disorder, combined type: Secondary | ICD-10-CM | POA: Diagnosis not present

## 2022-09-01 DIAGNOSIS — E66811 Obesity, class 1: Secondary | ICD-10-CM | POA: Insufficient documentation

## 2022-09-01 DIAGNOSIS — F411 Generalized anxiety disorder: Secondary | ICD-10-CM | POA: Diagnosis not present

## 2022-09-01 DIAGNOSIS — Z6836 Body mass index (BMI) 36.0-36.9, adult: Secondary | ICD-10-CM

## 2022-09-01 DIAGNOSIS — E6609 Other obesity due to excess calories: Secondary | ICD-10-CM | POA: Diagnosis not present

## 2022-09-01 MED ORDER — METFORMIN HCL 500 MG PO TABS: 500.0000 mg | ORAL_TABLET | Freq: Two times a day (BID) | ORAL | 2 refills | Status: DC

## 2022-09-01 MED ORDER — AMPHETAMINE-DEXTROAMPHETAMINE 15 MG PO TABS: 15.0000 mg | ORAL_TABLET | Freq: Two times a day (BID) | ORAL | 0 refills | Status: DC

## 2022-09-01 MED ORDER — BUSPIRONE HCL 7.5 MG PO TABS: 7.5000 mg | ORAL_TABLET | Freq: Three times a day (TID) | ORAL | 2 refills | Status: DC

## 2022-09-01 MED ORDER — ALPRAZOLAM 0.5 MG PO TABS: 0.5000 mg | ORAL_TABLET | Freq: Every evening | ORAL | 1 refills | Status: DC | PRN
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
## 2022-09-11 DIAGNOSIS — L0591 Pilonidal cyst without abscess: Secondary | ICD-10-CM | POA: Diagnosis not present

## 2022-09-11 DIAGNOSIS — K529 Noninfective gastroenteritis and colitis, unspecified: Secondary | ICD-10-CM | POA: Diagnosis not present

## 2022-09-24 ENCOUNTER — Ambulatory Visit: Payer: Medicaid Other | Admitting: Physician Assistant

## 2022-09-29 ENCOUNTER — Ambulatory Visit: Payer: Medicaid Other | Admitting: Physician Assistant

## 2022-10-03 ENCOUNTER — Ambulatory Visit: Payer: Self-pay | Admitting: Surgery

## 2022-10-03 ENCOUNTER — Encounter (HOSPITAL_COMMUNITY): Payer: Self-pay | Admitting: Surgery

## 2022-10-03 ENCOUNTER — Other Ambulatory Visit: Payer: Self-pay

## 2022-10-03 NOTE — Progress Notes (Signed)
PCP - Tandy Gaw, PA Cardiologist - Denies  EKG - Denies Chest x-ray - 01/14/2018 ECHO - Denies Cardiac Cath - Denies  CPAP - Denies  DM-  Denies.  On Metformin for insulin resistance.    Blood Thinner Instructions: Denies Aspirin Instructions: Denies  ERAS Protcol - Yes COVID TEST- N/A.  Denies SOB, and symptoms.  Anesthesia review: No  -------------  SDW INSTRUCTIONS:  Your procedure is scheduled on Monday April 22. Please report to [facility] Main Entrance "A" at 0945 A.M., and check in at the Admitting office. Call this number if you have problems the morning of surgery: [phone]   Remember: Do not eat  after midnight the night before your surgery  You may drink clear liquids until 0915 the morning of your surgery.   Clear liquids allowed are: Water, Non-Citrus Juices (without pulp), Carbonated Beverages, Clear Tea, Black Coffee Only, and Gatorade   Medications to take morning of surgery with a sip of water include: busPIRone (BUSPAR)    As of today, STOP taking any Aspirin (unless otherwise instructed by your surgeon), Aleve, Naproxen, Ibuprofen, Motrin, Advil (ibuprofen (ADVIL) , Goody's, BC's, all herbal medications, fish oil, and all vitamins.    The Morning of Surgery Do not wear jewelry, make-up or nail polish. Do not wear lotions, powders, or perfumes, or deodorant Do not bring valuables to the hospital. [facility] is not responsible for any belongings or valuables.  If you are a smoker, DO NOT Smoke 24 hours prior to surgery  If you wear a CPAP at night please bring your mask the morning of surgery   Remember that you must have someone to transport you home after your surgery, and remain with you for 24 hours if you are discharged the same day.  Please bring cases for contacts, glasses, hearing aids, dentures or bridgework because it cannot be worn into surgery.   Patients discharged the day of surgery will not be allowed to drive home.    Please shower the NIGHT BEFORE/MORNING OF SURGERY (use antibacterial soap like DIAL soap if possible). Wear comfortable clothes the morning of surgery. Oral Hygiene is also important to reduce your risk of infection.  Remember - BRUSH YOUR TEETH THE MORNING OF SURGERY WITH YOUR REGULAR TOOTHPASTE  Patient denies shortness of breath, fever, cough and chest pain.

## 2022-10-06 ENCOUNTER — Other Ambulatory Visit: Payer: Self-pay

## 2022-10-06 ENCOUNTER — Encounter (HOSPITAL_COMMUNITY): Payer: Self-pay | Admitting: Surgery

## 2022-10-06 ENCOUNTER — Ambulatory Visit (HOSPITAL_BASED_OUTPATIENT_CLINIC_OR_DEPARTMENT_OTHER): Payer: Medicaid Other | Admitting: Anesthesiology

## 2022-10-06 ENCOUNTER — Encounter (HOSPITAL_COMMUNITY)
Admission: RE | Disposition: A | Discharge status: Home / Self Care | Payer: Self-pay | Source: Home / Self Care | Attending: Surgery

## 2022-10-06 ENCOUNTER — Ambulatory Visit (HOSPITAL_COMMUNITY)
Admission: RE | Admit: 2022-10-06 | Discharge: 2022-10-06 | Disposition: A | Discharge status: Home / Self Care | Payer: Medicaid Other | Attending: Surgery | Admitting: Surgery

## 2022-10-06 ENCOUNTER — Ambulatory Visit (HOSPITAL_COMMUNITY): Payer: Medicaid Other | Admitting: Anesthesiology

## 2022-10-06 DIAGNOSIS — L0591 Pilonidal cyst without abscess: Secondary | ICD-10-CM | POA: Insufficient documentation

## 2022-10-06 DIAGNOSIS — F418 Other specified anxiety disorders: Secondary | ICD-10-CM

## 2022-10-06 DIAGNOSIS — Z6832 Body mass index (BMI) 32.0-32.9, adult: Secondary | ICD-10-CM | POA: Diagnosis not present

## 2022-10-06 DIAGNOSIS — F17210 Nicotine dependence, cigarettes, uncomplicated: Secondary | ICD-10-CM | POA: Insufficient documentation

## 2022-10-06 DIAGNOSIS — Z7984 Long term (current) use of oral hypoglycemic drugs: Secondary | ICD-10-CM | POA: Insufficient documentation

## 2022-10-06 HISTORY — PX: PILONIDAL CYST EXCISION: SHX744

## 2022-10-06 HISTORY — DX: Anxiety disorder, unspecified: F41.9

## 2022-10-06 HISTORY — DX: Myoneural disorder, unspecified: G70.9

## 2022-10-06 LAB — BASIC METABOLIC PANEL
Anion gap: 13 (ref 5–15)
BUN: 9 mg/dL (ref 6–20)
CO2: 19 mmol/L — ABNORMAL LOW (ref 22–32)
Calcium: 9.2 mg/dL (ref 8.9–10.3)
Chloride: 103 mmol/L (ref 98–111)
Creatinine, Ser: 0.71 mg/dL (ref 0.44–1.00)
GFR, Estimated: >60 mL/min (ref >60)
Glucose, Bld: 119 mg/dL — ABNORMAL HIGH (ref 70–99)
Potassium: 3.8 mmol/L (ref 3.5–5.1)
Sodium: 135 mmol/L (ref 135–145)

## 2022-10-06 LAB — CBC
HCT: 41.5 % (ref 36.0–46.0)
Hemoglobin: 14.2 g/dL (ref 12.0–15.0)
MCH: 30.6 pg (ref 26.0–34.0)
MCHC: 34.2 g/dL (ref 30.0–36.0)
MCV: 89.4 fL (ref 80.0–100.0)
Platelets: 312 10*3/uL (ref 150–400)
RBC: 4.64 MIL/uL (ref 3.87–5.11)
RDW: 13.2 % (ref 11.5–15.5)
WBC: 10.1 10*3/uL (ref 4.0–10.5)
nRBC: 0 % (ref 0.0–0.2)

## 2022-10-06 LAB — POCT PREGNANCY, URINE: Preg Test, Ur: NEGATIVE

## 2022-10-06 SURGERY — EXCISION, PILONIDAL CYST, EXTENSIVE
Anesthesia: General | Site: Buttocks

## 2022-10-06 MED ORDER — DEXMEDETOMIDINE HCL IN NACL 80 MCG/20ML IV SOLN
INTRAVENOUS | Status: DC | PRN
  Administered 2022-10-06: 4 ug via BUCCAL
  Administered 2022-10-06 (×2): 8 ug via BUCCAL

## 2022-10-06 MED ORDER — ROCURONIUM BROMIDE 10 MG/ML (PF) SYRINGE
PREFILLED_SYRINGE | INTRAVENOUS | Status: DC | PRN
  Administered 2022-10-06: 50 mg via INTRAVENOUS

## 2022-10-06 MED ORDER — OXYCODONE HCL 5 MG PO TABS: 5.0000 mg | ORAL_TABLET | ORAL | 0 refills | Status: DC | PRN

## 2022-10-06 MED ORDER — METHOCARBAMOL 750 MG PO TABS: 750.0000 mg | ORAL_TABLET | Freq: Four times a day (QID) | ORAL | 1 refills | Status: DC

## 2022-10-06 MED ORDER — FENTANYL CITRATE (PF) 100 MCG/2ML IJ SOLN: 25.0000 ug | INTRAMUSCULAR | Status: DC | PRN

## 2022-10-06 MED ORDER — LACTATED RINGERS IV SOLN: INTRAVENOUS | Status: DC

## 2022-10-06 MED ORDER — LIDOCAINE 2% (20 MG/ML) 5 ML SYRINGE
INTRAMUSCULAR | Status: DC | PRN
  Administered 2022-10-06: 80 mg via INTRAVENOUS

## 2022-10-06 MED ORDER — PROPOFOL 10 MG/ML IV BOLUS
INTRAVENOUS | Status: DC | PRN
  Administered 2022-10-06: 200 mg via INTRAVENOUS

## 2022-10-06 MED ORDER — MEPERIDINE HCL 25 MG/ML IJ SOLN: 6.2500 mg | INTRAMUSCULAR | Status: DC | PRN

## 2022-10-06 MED ORDER — BUPIVACAINE LIPOSOME 1.3 % IJ SUSP
INTRAMUSCULAR | Status: AC
  Filled 2022-10-06: qty 20

## 2022-10-06 MED ORDER — CHLORHEXIDINE GLUCONATE CLOTH 2 % EX PADS: 6.0000 | MEDICATED_PAD | Freq: Once | CUTANEOUS | Status: DC

## 2022-10-06 MED ORDER — CEFAZOLIN SODIUM-DEXTROSE 2-4 GM/100ML-% IV SOLN
2.0000 g | INTRAVENOUS | Status: AC
  Administered 2022-10-06: 2 g via INTRAVENOUS
  Filled 2022-10-06: qty 100

## 2022-10-06 MED ORDER — BUPIVACAINE LIPOSOME 1.3 % IJ SUSP: 20.0000 mL | Freq: Once | INTRAMUSCULAR | Status: DC

## 2022-10-06 MED ORDER — FENTANYL CITRATE (PF) 250 MCG/5ML IJ SOLN
INTRAMUSCULAR | Status: AC
  Filled 2022-10-06: qty 5

## 2022-10-06 MED ORDER — BUPIVACAINE LIPOSOME 1.3 % IJ SUSP
INTRAMUSCULAR | Status: DC | PRN
  Administered 2022-10-06: 20 mL

## 2022-10-06 MED ORDER — BUPIVACAINE HCL (PF) 0.25 % IJ SOLN
INTRAMUSCULAR | Status: AC
  Filled 2022-10-06: qty 30

## 2022-10-06 MED ORDER — DEXAMETHASONE SODIUM PHOSPHATE 10 MG/ML IJ SOLN
INTRAMUSCULAR | Status: DC | PRN
  Administered 2022-10-06: 8 mg via INTRAVENOUS

## 2022-10-06 MED ORDER — FENTANYL CITRATE (PF) 250 MCG/5ML IJ SOLN
INTRAMUSCULAR | Status: DC | PRN
  Administered 2022-10-06: 50 ug via INTRAVENOUS
  Administered 2022-10-06: 100 ug via INTRAVENOUS

## 2022-10-06 MED ORDER — OXYCODONE HCL 5 MG/5ML PO SOLN: 5.0000 mg | Freq: Once | ORAL | Status: DC | PRN

## 2022-10-06 MED ORDER — SUGAMMADEX SODIUM 200 MG/2ML IV SOLN
INTRAVENOUS | Status: DC | PRN
  Administered 2022-10-06: 200 mg via INTRAVENOUS

## 2022-10-06 MED ORDER — ACETAMINOPHEN 325 MG PO TABS: 325.0000 mg | ORAL_TABLET | ORAL | Status: DC | PRN

## 2022-10-06 MED ORDER — ONDANSETRON HCL 4 MG/2ML IJ SOLN
INTRAMUSCULAR | Status: DC | PRN
  Administered 2022-10-06: 4 mg via INTRAVENOUS

## 2022-10-06 MED ORDER — ORAL CARE MOUTH RINSE: 15.0000 mL | Freq: Once | OROMUCOSAL | Status: AC

## 2022-10-06 MED ORDER — MIDAZOLAM HCL 2 MG/2ML IJ SOLN
INTRAMUSCULAR | Status: DC | PRN
  Administered 2022-10-06: 2 mg via INTRAVENOUS

## 2022-10-06 MED ORDER — ACETAMINOPHEN 160 MG/5ML PO SOLN: 325.0000 mg | ORAL | Status: DC | PRN

## 2022-10-06 MED ORDER — PROPOFOL 10 MG/ML IV BOLUS
INTRAVENOUS | Status: AC
  Filled 2022-10-06: qty 20

## 2022-10-06 MED ORDER — IBUPROFEN 600 MG PO TABS: 600.0000 mg | ORAL_TABLET | Freq: Four times a day (QID) | ORAL | 1 refills | Status: DC

## 2022-10-06 MED ORDER — 0.9 % SODIUM CHLORIDE (POUR BTL) OPTIME
TOPICAL | Status: DC | PRN
  Administered 2022-10-06: 1000 mL

## 2022-10-06 MED ORDER — PHENYLEPHRINE 80 MCG/ML (10ML) SYRINGE FOR IV PUSH (FOR BLOOD PRESSURE SUPPORT)
PREFILLED_SYRINGE | INTRAVENOUS | Status: DC | PRN
  Administered 2022-10-06: 160 ug via INTRAVENOUS

## 2022-10-06 MED ORDER — BUPIVACAINE HCL 0.25 % IJ SOLN
INTRAMUSCULAR | Status: DC | PRN
  Administered 2022-10-06: 20 mL

## 2022-10-06 MED ORDER — ACETAMINOPHEN 500 MG PO TABS: 1000.0000 mg | ORAL_TABLET | Freq: Four times a day (QID) | ORAL | 3 refills | Status: DC

## 2022-10-06 MED ORDER — DOCUSATE SODIUM 100 MG PO CAPS: 100.0000 mg | ORAL_CAPSULE | Freq: Two times a day (BID) | ORAL | 2 refills | Status: DC

## 2022-10-06 MED ORDER — ENOXAPARIN SODIUM 40 MG/0.4ML IJ SOSY
40.0000 mg | PREFILLED_SYRINGE | Freq: Once | INTRAMUSCULAR | Status: AC
  Administered 2022-10-06: 40 mg via SUBCUTANEOUS
  Filled 2022-10-06: qty 0.4

## 2022-10-06 MED ORDER — CHLORHEXIDINE GLUCONATE 0.12 % MT SOLN
15.0000 mL | Freq: Once | OROMUCOSAL | Status: AC
  Administered 2022-10-06: 15 mL via OROMUCOSAL
  Filled 2022-10-06: qty 15

## 2022-10-06 MED ORDER — ONDANSETRON HCL 4 MG/2ML IJ SOLN: 4.0000 mg | Freq: Once | INTRAMUSCULAR | Status: DC | PRN

## 2022-10-06 MED ORDER — MIDAZOLAM HCL 2 MG/2ML IJ SOLN
INTRAMUSCULAR | Status: AC
  Filled 2022-10-06: qty 2

## 2022-10-06 MED ORDER — OXYCODONE HCL 5 MG PO TABS: 5.0000 mg | ORAL_TABLET | Freq: Once | ORAL | Status: DC | PRN

## 2022-10-06 SURGICAL SUPPLY — 31 items
BAG COUNTER SPONGE SURGICOUNT (BAG) ×1 IMPLANT
BAG SPNG CNTER NS LX DISP (BAG) ×1
BLADE CLIPPER SURG (BLADE) IMPLANT
BRIEF MESH DISP LRG (UNDERPADS AND DIAPERS) ×1 IMPLANT
CANISTER SUCT 3000ML PPV (MISCELLANEOUS) IMPLANT
CNTNR URN SCR LID CUP LEK RST (MISCELLANEOUS) ×1 IMPLANT
CONT SPEC 4OZ STRL OR WHT (MISCELLANEOUS) ×1
COVER SURGICAL LIGHT HANDLE (MISCELLANEOUS) ×1 IMPLANT
DRAPE LAPAROTOMY T 102X78X121 (DRAPES) ×1 IMPLANT
ELECT REM PT RETURN 9FT ADLT (ELECTROSURGICAL) ×1
ELECTRODE REM PT RTRN 9FT ADLT (ELECTROSURGICAL) ×1 IMPLANT
GAUZE SPONGE 4X4 12PLY STRL (GAUZE/BANDAGES/DRESSINGS) ×1 IMPLANT
GAUZE XEROFORM 1X8 LF (GAUZE/BANDAGES/DRESSINGS) IMPLANT
GLOVE SURG UNDER POLY LF SZ6 (GLOVE) ×1 IMPLANT
GOWN STRL REUS W/ TWL LRG LVL3 (GOWN DISPOSABLE) ×1 IMPLANT
GOWN STRL REUS W/TWL LRG LVL3 (GOWN DISPOSABLE) ×2
KIT BASIN OR (CUSTOM PROCEDURE TRAY) ×1 IMPLANT
KIT TURNOVER KIT B (KITS) ×1 IMPLANT
MARKER SKIN DUAL TIP RULER LAB (MISCELLANEOUS) ×1 IMPLANT
NDL HYPO 25GX1X1/2 BEV (NEEDLE) IMPLANT
NEEDLE HYPO 25GX1X1/2 BEV (NEEDLE) ×1 IMPLANT
NS IRRIG 1000ML POUR BTL (IV SOLUTION) ×1 IMPLANT
PACK GENERAL/GYN (CUSTOM PROCEDURE TRAY) ×1 IMPLANT
PAD ARMBOARD 7.5X6 YLW CONV (MISCELLANEOUS) ×2 IMPLANT
PENCIL SMOKE EVACUATOR (MISCELLANEOUS) ×1 IMPLANT
SUT ETHILON 2 0 FS 18 (SUTURE) IMPLANT
SUT VIC AB 2-0 SH 18 (SUTURE) IMPLANT
SYR CONTROL 10ML LL (SYRINGE) ×1 IMPLANT
TOWEL GREEN STERILE (TOWEL DISPOSABLE) ×1 IMPLANT
TOWEL GREEN STERILE FF (TOWEL DISPOSABLE) ×1 IMPLANT
UNDERPAD 30X36 HEAVY ABSORB (UNDERPADS AND DIAPERS) ×1 IMPLANT

## 2022-10-06 NOTE — Discharge Instructions (Signed)
May shower beginning 10/07/2022. Be sure to shower at least once daily, may shower more if needed. May allow warm soapy water to run over incision, then rinse and pat dry. Do not soak in any water (tubs, hot tubs, pools, lakes, oceans) for two weeks.   Keep wound clean and dry, may apply a dry dressing to the wound if drainage is present or if clothing is irritating to the sutures, otherwise keep wound open to air. If applying a dressing, change at least once daily, may change more frequently as needed. Avoid topical products such as neosporin, bacitracin, etc, to minimize moisture to the area.   Minimize sitting as much as possible for the first two weeks after surgery, try to stand or lay down instead of sitting. May use  a donut to sit on to aid in comfort.   Pain regimen: take over-the-counter tylenol (acetaminophen)  every six hours, the prescription ibuprofen ( ) every six hours and the robaxin (methocarbamol)  every six hours. With all three of these, you should be taking something every two hours. Example: tylenol ( acetaminophen) at 8am, ibuprofen at 10am, robaxin (methocarbamol) at 12pm, tylenol (acetaminophen) again at 2pm, ibuprofen again at 4pm, robaxin (methocarbamol) at 6pm. You also have a prescription for oxycodone, which should be taken if the tylenol (acetaminophen), ibuprofen, and robaxin (methocarbamol) are not enough to control your pain. You may take the oxycodone as frequently as every four hours as needed, but if you are taking the other medications as above, you should not need the oxycodone this frequently. You have also been given a prescription for colace (docusate) which is a stool softener. Please take this as prescribed because the oxycodone can cause constipation and the colace (docusate) will minimize or prevent constipation.  Call the office at [phone] for temperature greater than 101.42F, worsening pain, redness or warmth at the incision site.  Please  call [phone] to make an appointment for 2 weeks after surgery for wound check and suture removal.

## 2022-10-06 NOTE — H&P (Signed)
Patricia Potter is an 37 y.o. female.   HPI: 75F with pilonidal cyst. Plan for excision today. The patient has had no hospitalizations, ER visits, surgeries, or newly diagnosed allergies since being seen in the office. Saw her PCP for a regularly scheduled appointment for medication refill.   Past Medical History:  Diagnosis Date   Anxiety    COVID-19 03/2020   pt has had COVID x 3 - no vaccine   Neuromuscular disorder    Papillary carcinoma 2015    Past Surgical History:  Procedure Laterality Date    Thyroidectomy Left 2015    Family History  Problem Relation Age of Onset   COPD Mother    Heart failure Mother    Rheum arthritis Mother    Breast cancer Maternal Grandmother 68    Social History:  reports that she has been smoking cigarettes. She has been smoking an average of .5 packs per day. She has never been exposed to tobacco smoke. She has never used smokeless tobacco. She reports current alcohol use of about 14.0 standard drinks of alcohol per week. She reports that she does not currently use drugs.  Allergies:  Allergies  Allergen Reactions   Iodine Anaphylaxis    Contrast dye    Wellbutrin [Bupropion] Anxiety    Made anxiety worse    Medications: I have reviewed the patient's current medications.  Results for orders placed or performed during the hospital encounter of 10/06/22 (from the past 48 hour(s))  Pregnancy, urine POC     Status: None   Collection Time: 10/06/22 10:01 AM  Result Value Ref Range   Preg Test, Ur NEGATIVE NEGATIVE    Comment:        THE SENSITIVITY OF THIS METHODOLOGY IS >24 mIU/mL   CBC per protocol     Status: None   Collection Time: 10/06/22 10:20 AM  Result Value Ref Range   WBC 10.1 4.0 - 10.5 K/uL   RBC 4.64 3.87 - 5.11 MIL/uL   Hemoglobin 14.2 12.0 - 15.0 g/dL   HCT 16.1 09.6 - 04.5 %   MCV 89.4 80.0 - 100.0 fL   MCH 30.6 26.0 - 34.0 pg   MCHC 34.2 30.0 - 36.0 g/dL   RDW 40.9 81.1 - 91.4 %   Platelets 312 150 - 400  K/uL   nRBC 0.0 0.0 - 0.2 %    Comment: Performed at [facility], [address] [place], [place] [address]  Basic metabolic panel per protocol     Status: Abnormal   Collection Time: 10/06/22 10:20 AM  Result Value Ref Range   Sodium 135 135 - 145 mmol/L   Potassium 3.8 3.5 - 5.1 mmol/L   Chloride 103 98 - 111 mmol/L   CO2 19 (L) 22 - 32 mmol/L   Glucose, Bld 119 (H) 70 - 99 mg/dL    Comment: Glucose reference range applies only to samples taken after fasting for at least 8 hours.   BUN 9 6 - 20 mg/dL   Creatinine, Ser 6.21 0.44 - 1.00 mg/dL   Calcium 9.2 8.9 - 30.8 mg/dL   GFR, Estimated >65 >78 mL/min    Comment: (NOTE) Calculated using the CKD-EPI Creatinine Equation (2021)    Anion gap 13 5 - 15    Comment: Performed at [facility], [address] [place], [place] [address]    No results found.  ROS 10 point review of systems is negative except as listed above  in HPI.   Physical Exam Blood pressure (!) 132/91, pulse 97, temperature 98.7 F (37.1 C), resp. rate 16, height  (1.6 m), weight 83.9 kg, last menstrual period 08/12/2022, SpO2 95 %. Constitutional: well-developed, well-nourished HEENT: pupils equal, round, reactive to light, 2mm b/l, moist conjunctiva, external inspection of ears and nose normal, hearing intact Oropharynx: normal oropharyngeal mucosa, normal dentition Neck: no thyromegaly, trachea midline, no midline cervical tenderness to palpation Chest: breath sounds equal bilaterally, normal respiratory effort, no midline or lateral chest wall tenderness to palpation/deformity Abdomen: soft, NT, no bruising, no hepatosplenomegaly GU: normal female genitalia  Back: no wounds, no thoracic/lumbar spine tenderness to palpation, no thoracic/lumbar spine stepoffs Rectal:  pilonidal cyst Extremities: 2+ radial and pedal pulses bilaterally, intact motor and sensation bilateral UE and LE, no peripheral edema MSK: unable to assess gait/station, no  clubbing/cyanosis of fingers/toes, normal ROM of all four extremities Skin: warm, dry, no rashes Psych: normal memory, normal mood/affect     Assessment/Plan: 63F with piolonidal cyst. Plan for excision. Informed consent was obtained after detailed explanation of risks, including bleeding, infection, hematoma/seroma, temporary or permanent neuropathy, dehiscence, and recurrence. All questions answered to the patient's satisfaction.   Diamantina Monks, MD General and Trauma Surgery [facility]

## 2022-10-06 NOTE — Anesthesia Preprocedure Evaluation (Addendum)
Anesthesia Evaluation  Patient identified by MRN, date of birth, ID band Patient awake    Reviewed: Allergy & Precautions, H&P , NPO status , Patient's Chart, lab work & pertinent test results  Airway Mallampati: I  TM Distance: >3 FB Neck ROM: Full    Dental no notable dental hx. (+) Teeth Intact, Dental Advisory Given   Pulmonary neg pulmonary ROS, Current Smoker and Patient abstained from smoking.   Pulmonary exam normal breath sounds clear to auscultation       Cardiovascular Exercise Tolerance: Good negative cardio ROS Normal cardiovascular exam Rhythm:Regular Rate:Normal     Neuro/Psych  PSYCHIATRIC DISORDERS Anxiety Depression     Neuromuscular disease negative neurological ROS  negative psych ROS   GI/Hepatic negative GI ROS, Neg liver ROS,,,  Endo/Other  negative endocrine ROS  Morbid obesity  Renal/GU negative Renal ROS  negative genitourinary   Musculoskeletal negative musculoskeletal ROS (+) Arthritis ,    Abdominal   Peds negative pediatric ROS (+)  Hematology negative hematology ROS (+)   Anesthesia Other Findings   Reproductive/Obstetrics negative OB ROS                             Anesthesia Physical Anesthesia Plan  ASA: 3  Anesthesia Plan: General   Post-op Pain Management: Minimal or no pain anticipated, Tylenol PO (pre-op)* and Celebrex PO (pre-op)*   Induction: Intravenous  PONV Risk Score and Plan: 3 and Ondansetron, Dexamethasone and Treatment may vary due to age or medical condition  Airway Management Planned: Oral ETT and LMA  Additional Equipment: None  Intra-op Plan:   Post-operative Plan: Extubation in OR  Informed Consent: I have reviewed the patients History and Physical, chart, labs and discussed the procedure including the risks, benefits and alternatives for the proposed anesthesia with the patient or authorized representative who has  indicated his/her understanding and acceptance.       Plan Discussed with: Anesthesiologist and CRNA  Anesthesia Plan Comments: (  )        Anesthesia Quick Evaluation

## 2022-10-06 NOTE — Anesthesia Procedure Notes (Signed)
Procedure Name: Intubation Date/Time: 10/06/2022 11:38 AM  Performed by: Katina Degree, CRNAPre-anesthesia Checklist: Patient identified, Emergency Drugs available, Suction available and Patient being monitored Patient Re-evaluated:Patient Re-evaluated prior to induction Oxygen Delivery Method: Circle system utilized Preoxygenation: Pre-oxygenation with 100% oxygen Induction Type: IV induction Ventilation: Mask ventilation without difficulty Laryngoscope Size: Mac and 4 Grade View: Grade I Tube type: Oral Tube size: 7.0 mm Number of attempts: 1 Airway Equipment and Method: Stylet and Oral airway Placement Confirmation: ETT inserted through vocal cords under direct vision, positive ETCO2 and breath sounds checked- equal and bilateral Secured at: 22 cm Tube secured with: Tape Dental Injury: Teeth and Oropharynx as per pre-operative assessment

## 2022-10-06 NOTE — Transfer of Care (Signed)
Immediate Anesthesia Transfer of Care Note  Patient: Patricia Potter  Procedure(s) Performed: EXCISION OF PILONIDAL CYST (Buttocks)  Patient Location: PACU  Anesthesia Type:General  Level of Consciousness: awake  Airway & Oxygen Therapy: Patient Spontanous Breathing  Post-op Assessment: Report given to RN and Post -op Vital signs reviewed and stable  Post vital signs: Reviewed and stable  Last Vitals:  Vitals Value Taken Time  BP 116/71 10/06/22 1236  Temp    Pulse 86 10/06/22 1238  Resp 16 10/06/22 1238  SpO2 96 % 10/06/22 1238  Vitals shown include unvalidated device data.  Last Pain:  Vitals:   10/06/22 1036  PainSc: 0-No pain         Complications: No notable events documented.

## 2022-10-06 NOTE — Anesthesia Postprocedure Evaluation (Signed)
Anesthesia Post Note  Patient: MAE DENUNZIO  Procedure(s) Performed: EXCISION OF PILONIDAL CYST (Buttocks)     Patient location during evaluation: PACU Anesthesia Type: General Level of consciousness: awake and alert Pain management: pain level controlled Vital Signs Assessment: post-procedure vital signs reviewed and stable Respiratory status: spontaneous breathing, nonlabored ventilation, respiratory function stable and patient connected to nasal cannula oxygen Cardiovascular status: blood pressure returned to baseline and stable Postop Assessment: no apparent nausea or vomiting Anesthetic complications: no   No notable events documented.  Last Vitals:  Vitals:   10/06/22 1245 10/06/22 1300  BP: 114/70 106/64  Pulse: 83 74  Resp: 15 13  Temp:    SpO2: 97% 96%    Last Pain:  Vitals:   10/06/22 1300  PainSc: Asleep                 Alleya Demeter

## 2022-10-06 NOTE — Op Note (Signed)
   Operative Note   Date: 10/06/2022  Procedure: excision of pilonidal cyst  Pre-op diagnosis: pilonidal cyst Post-op diagnosis: same  Indication and clinical history: The patient is a 37 y.o. year old female with pilonidal cyst     Surgeon: Diamantina Monks, MD  Anesthesiologist: Andres Ege, MD Anesthesia: General  Findings:  Specimen: pilonidal cyst EBL: <5cc Drains/Implants: none  Disposition: PACU - hemodynamically stable.  Description of procedure: The patient was positioned supine on the operating room table. General anesthetic induction and intubation were uneventful. Foley catheter insertion was performed and was atraumatic. Time-out was performed verifying correct patient, procedure, signature of informed consent, and administration of pre-operative antibiotics. The buttocks were prepped and draped in the usual sterile fashion after repositioning to prone.  An elliptical incision was made encompassing the pilonidal cyst and its associated tract. The excision was carried down to the level of the sacrum and circumferentially excised. A layered closure was performed with vicryl deep and nylon mattress sutures superficially.   Sterile dressings were applied. All sponge and instrument counts were correct at the conclusion of the procedure. The patient was awakened from anesthesia, extubated uneventfully, and transported to the PACU in good condition. There were no complications.    Diamantina Monks, MD General and Trauma Surgery [facility]

## 2022-10-07 ENCOUNTER — Encounter (HOSPITAL_COMMUNITY): Payer: Self-pay | Admitting: Surgery

## 2022-10-07 LAB — SURGICAL PATHOLOGY

## 2022-10-27 ENCOUNTER — Other Ambulatory Visit: Payer: Self-pay | Admitting: Physician Assistant

## 2022-10-27 DIAGNOSIS — F902 Attention-deficit hyperactivity disorder, combined type: Secondary | ICD-10-CM

## 2022-11-21 IMAGING — MG DIGITAL DIAGNOSTIC BILAT W/ TOMO W/ CAD
6 of 10 series · 6 of 30 positions shown · non-contrast
Comparison: None.

CLINICAL DATA: 34-year-old female with a physician palpated right
breast lump.

EXAM:
DIGITAL DIAGNOSTIC BILATERAL MAMMOGRAM WITH TOMOSYNTHESIS AND CAD;
ULTRASOUND RIGHT BREAST LIMITED
TECHNIQUE: Bilateral digital diagnostic mammography and breast tomosynthesis
was performed. The images were evaluated with computer-aided
detection.; Targeted ultrasound examination of the right breast was
performed

[R CC synth-2D]
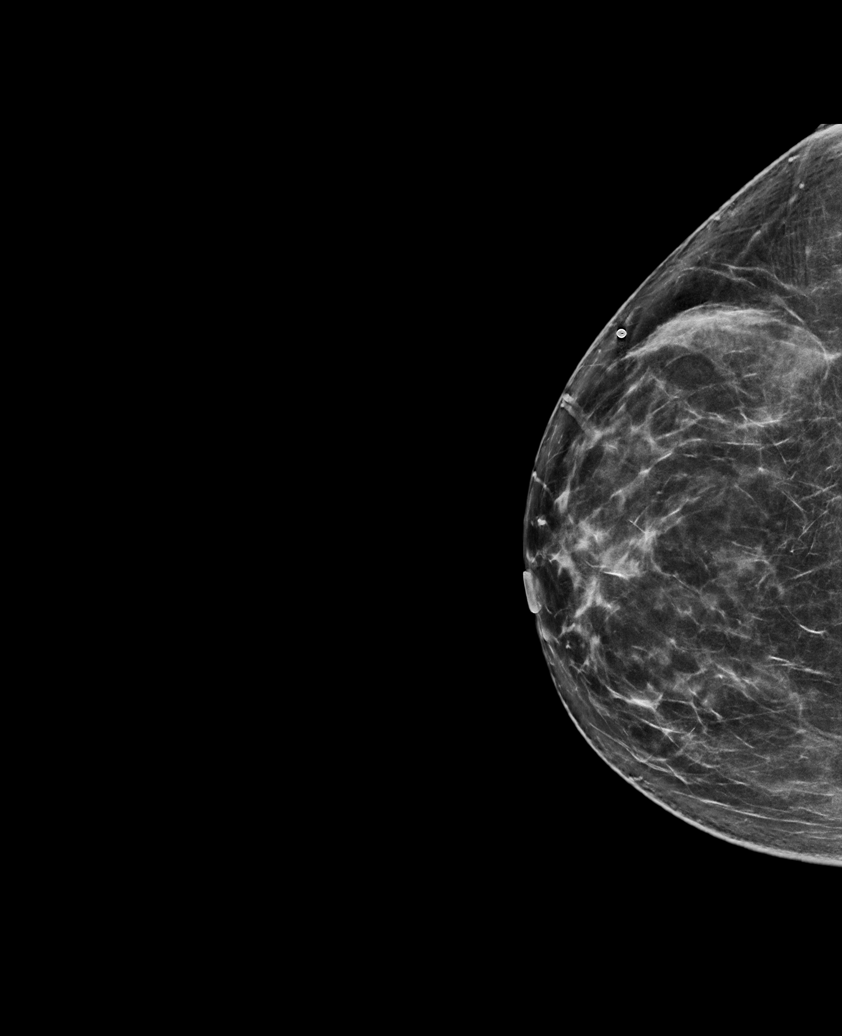

[R MLO synth-2D (1 of 2)]
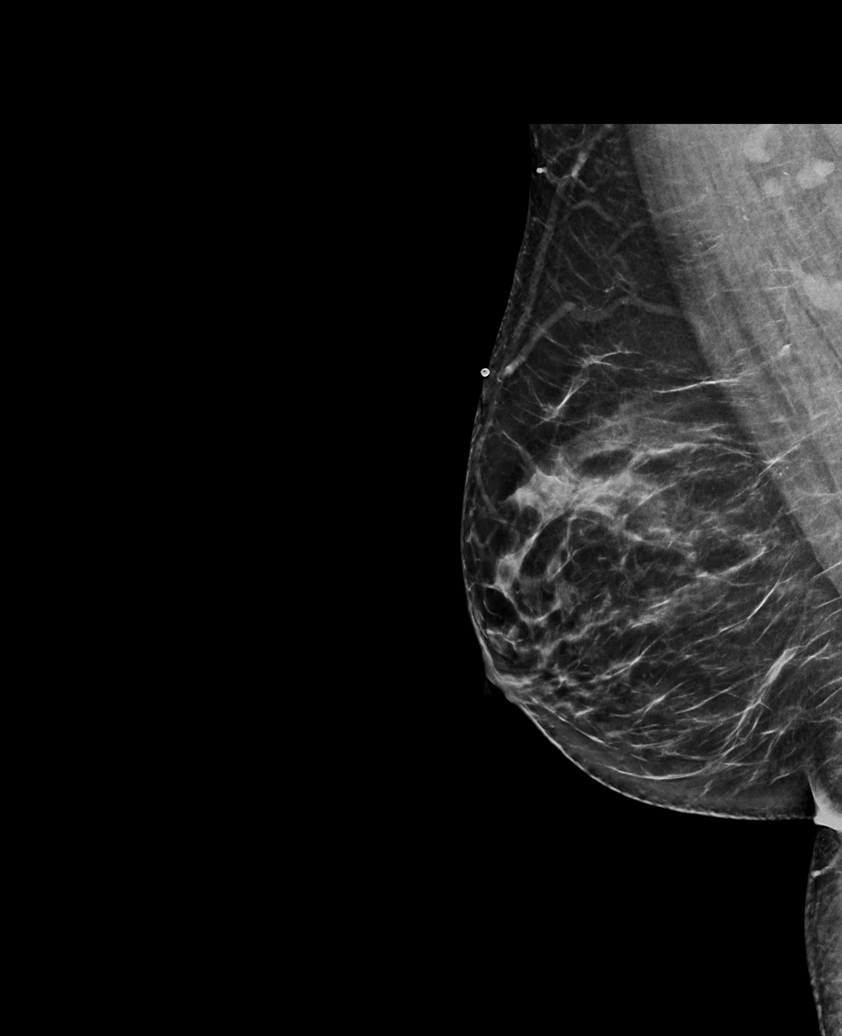

[R MLO synth-2D (2 of 2)]
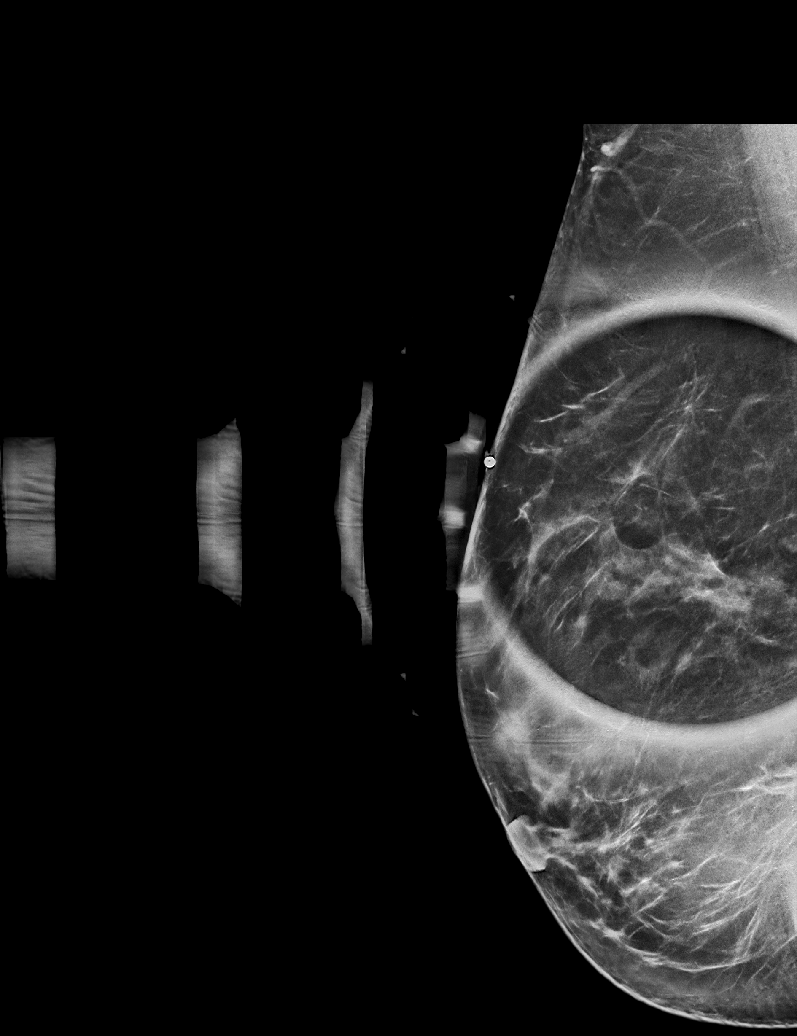

[L CC synth-2D]
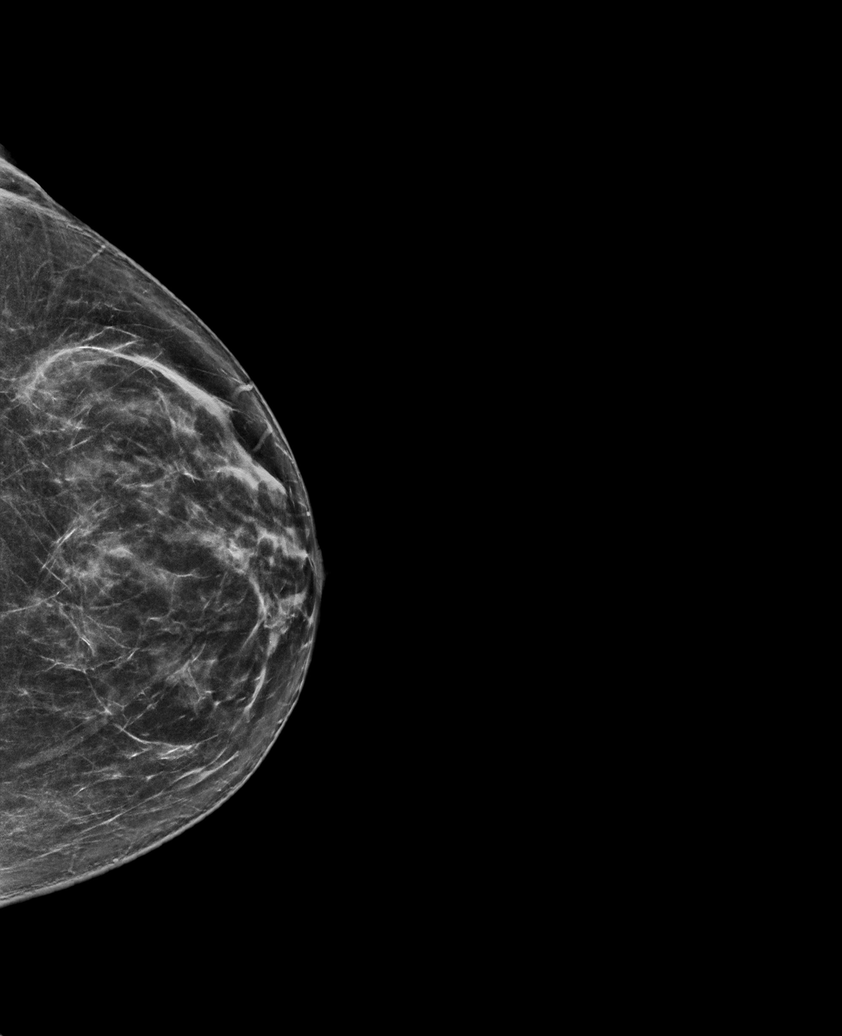

[L MLO synth-2D]
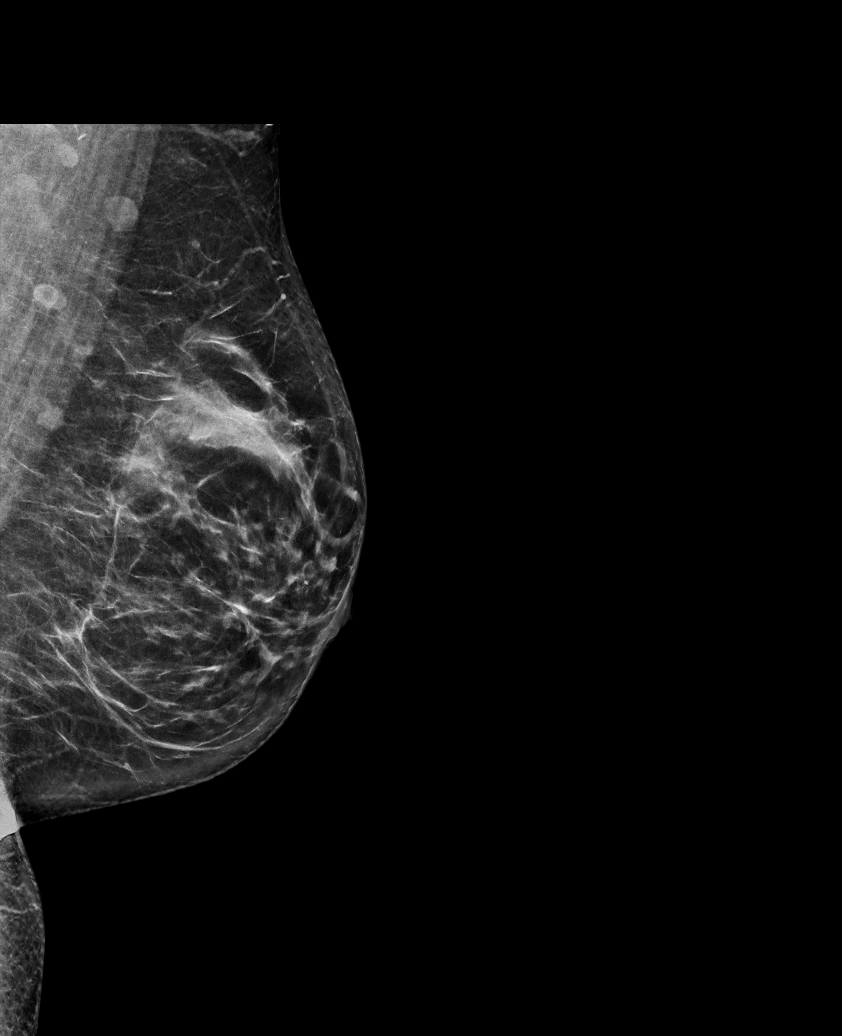

[L CC tomo · tomo slice 37/72.0]
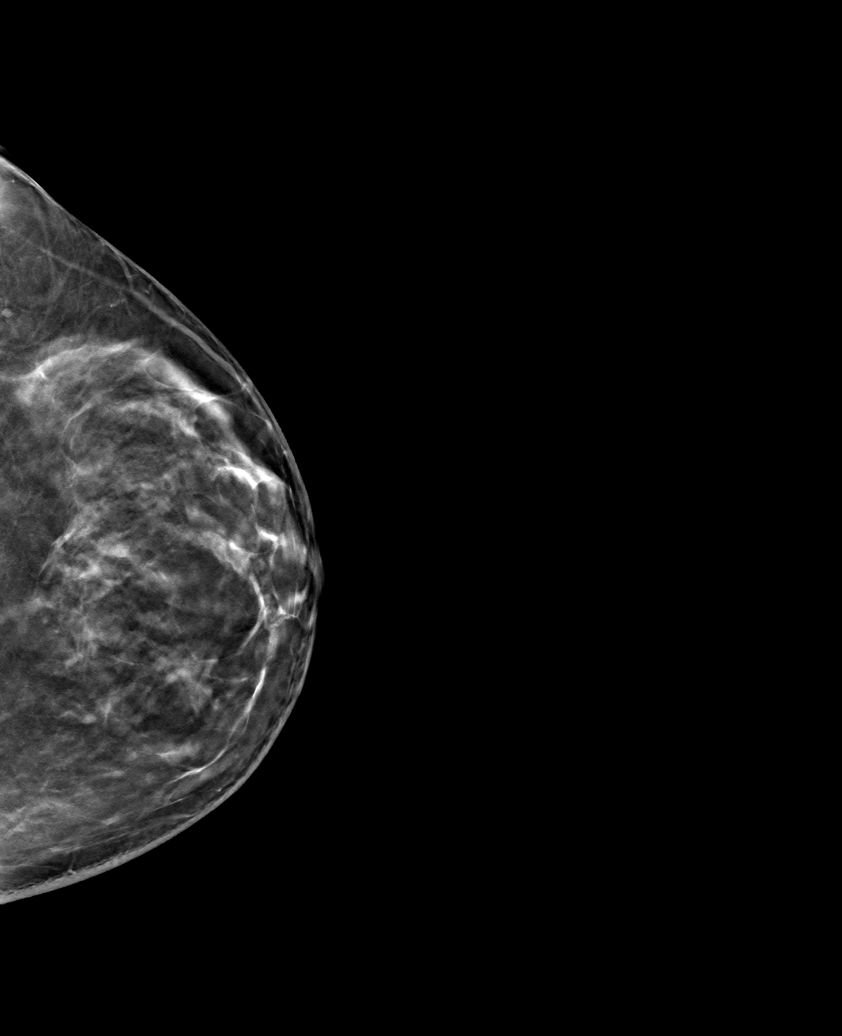

[6 of 30 positions shown; findings below may reference images not displayed]

ACR Breast Density Category b: There are scattered areas of
fibroglandular density.
FINDINGS: A radiopaque BB was placed at the site of the physician palpated
lump in the upper-outer right breast. No focal or suspicious
findings are seen deep to the radiopaque BB. No suspicious findings
are identified in either breast.

Targeted ultrasound is performed, showing normal fibroglandular
tissue without focal or suspicious sonographic abnormality.
IMPRESSION: 1. No mammographic evidence of malignancy in either breast.
2. Unremarkable ultrasound evaluation of the right breast.

RECOMMENDATION:
1. Clinical follow-up recommended for the palpable area of concern
in the right breast. Any further workup should be based on clinical
grounds.
2. Screening mammogram at age 41 unless there are persistent or
intervening clinical concerns. (Code:[id number]-G-2R3)

I have discussed the findings and recommendations with the patient.
If applicable, a reminder letter will be sent to the patient
regarding the next appointment.

BI-RADS CATEGORY  1: Negative.

## 2022-12-03 ENCOUNTER — Ambulatory Visit: Payer: Medicaid Other | Admitting: Physician Assistant

## 2022-12-04 ENCOUNTER — Other Ambulatory Visit: Payer: Self-pay | Admitting: Physician Assistant

## 2022-12-04 DIAGNOSIS — F902 Attention-deficit hyperactivity disorder, combined type: Secondary | ICD-10-CM

## 2022-12-05 MED ORDER — AMPHETAMINE-DEXTROAMPHETAMINE 15 MG PO TABS
15.0000 mg | ORAL_TABLET | Freq: Two times a day (BID) | ORAL | 0 refills | Status: DC
Start: 2023-01-04 — End: 2023-02-04

## 2022-12-05 MED ORDER — AMPHETAMINE-DEXTROAMPHETAMINE 15 MG PO TABS: 15.0000 mg | ORAL_TABLET | Freq: Two times a day (BID) | ORAL | 0 refills | Status: DC

## 2022-12-05 NOTE — Telephone Encounter (Signed)
Requesting rx rf of Adderal 15mg  tablet Last written 10/30/2022 Last OV 09/01/2022 No upcoming appt schld.

## 2023-01-02 DIAGNOSIS — R0789 Other chest pain: Secondary | ICD-10-CM | POA: Diagnosis not present

## 2023-01-02 DIAGNOSIS — R079 Chest pain, unspecified: Secondary | ICD-10-CM | POA: Diagnosis not present

## 2023-02-04 ENCOUNTER — Ambulatory Visit: Payer: Medicaid Other | Admitting: Physician Assistant

## 2023-02-04 ENCOUNTER — Encounter: Payer: Self-pay | Admitting: Physician Assistant

## 2023-02-04 VITALS — BP 126/88 | HR 101 | Ht 62.0 in | Wt 193.0 lb

## 2023-02-04 DIAGNOSIS — F902 Attention-deficit hyperactivity disorder, combined type: Secondary | ICD-10-CM | POA: Diagnosis not present

## 2023-02-04 DIAGNOSIS — M5412 Radiculopathy, cervical region: Secondary | ICD-10-CM | POA: Diagnosis not present

## 2023-02-04 DIAGNOSIS — M5030 Other cervical disc degeneration, unspecified cervical region: Secondary | ICD-10-CM | POA: Diagnosis not present

## 2023-02-04 DIAGNOSIS — F411 Generalized anxiety disorder: Secondary | ICD-10-CM | POA: Diagnosis not present

## 2023-02-04 MED ORDER — PREGABALIN 75 MG PO CAPS
75.0000 mg | ORAL_CAPSULE | Freq: Two times a day (BID) | ORAL | 2 refills | Status: DC
Start: 2023-02-04 — End: 2023-08-05

## 2023-02-04 MED ORDER — AMPHETAMINE-DEXTROAMPHETAMINE 15 MG PO TABS: 15.0000 mg | ORAL_TABLET | Freq: Two times a day (BID) | ORAL | 0 refills | Status: DC

## 2023-02-04 MED ORDER — ALPRAZOLAM 0.5 MG PO TABS
0.5000 mg | ORAL_TABLET | Freq: Every evening | ORAL | 2 refills | Status: DC | PRN
Start: 2023-02-04 — End: 2023-06-05

## 2023-02-04 MED ORDER — AMPHETAMINE-DEXTROAMPHETAMINE 15 MG PO TABS
15.0000 mg | ORAL_TABLET | Freq: Two times a day (BID) | ORAL | 0 refills | Status: DC
Start: 2023-03-06 — End: 2023-05-08
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
## 2023-03-11 ENCOUNTER — Emergency Department (HOSPITAL_BASED_OUTPATIENT_CLINIC_OR_DEPARTMENT_OTHER)
Admission: EM | Admit: 2023-03-11 | Discharge: 2023-03-11 | Disposition: A | Discharge status: Home / Self Care | Payer: Medicaid Other | Attending: Emergency Medicine | Admitting: Emergency Medicine

## 2023-03-11 ENCOUNTER — Ambulatory Visit: Payer: Medicaid Other | Admitting: Physician Assistant

## 2023-03-11 ENCOUNTER — Encounter: Payer: Self-pay | Admitting: Physician Assistant

## 2023-03-11 ENCOUNTER — Encounter (HOSPITAL_BASED_OUTPATIENT_CLINIC_OR_DEPARTMENT_OTHER): Payer: Self-pay | Admitting: Emergency Medicine

## 2023-03-11 ENCOUNTER — Ambulatory Visit: Payer: Medicaid Other

## 2023-03-11 VITALS — BP 146/90 | HR 122 | Ht 62.0 in | Wt 188.0 lb

## 2023-03-11 DIAGNOSIS — R059 Cough, unspecified: Secondary | ICD-10-CM | POA: Diagnosis not present

## 2023-03-11 DIAGNOSIS — R1013 Epigastric pain: Secondary | ICD-10-CM

## 2023-03-11 DIAGNOSIS — R000 Tachycardia, unspecified: Secondary | ICD-10-CM | POA: Diagnosis not present

## 2023-03-11 DIAGNOSIS — R0602 Shortness of breath: Secondary | ICD-10-CM

## 2023-03-11 DIAGNOSIS — R45851 Suicidal ideations: Secondary | ICD-10-CM | POA: Diagnosis not present

## 2023-03-11 DIAGNOSIS — F10920 Alcohol use, unspecified with intoxication, uncomplicated: Secondary | ICD-10-CM | POA: Diagnosis not present

## 2023-03-11 DIAGNOSIS — Z1152 Encounter for screening for COVID-19: Secondary | ICD-10-CM | POA: Insufficient documentation

## 2023-03-11 DIAGNOSIS — J029 Acute pharyngitis, unspecified: Secondary | ICD-10-CM

## 2023-03-11 DIAGNOSIS — R0789 Other chest pain: Secondary | ICD-10-CM

## 2023-03-11 DIAGNOSIS — R051 Acute cough: Secondary | ICD-10-CM | POA: Diagnosis not present

## 2023-03-11 DIAGNOSIS — Y905 Blood alcohol level of 100-119 mg/100 ml: Secondary | ICD-10-CM | POA: Insufficient documentation

## 2023-03-11 DIAGNOSIS — R079 Chest pain, unspecified: Secondary | ICD-10-CM | POA: Diagnosis not present

## 2023-03-11 DIAGNOSIS — F32A Depression, unspecified: Secondary | ICD-10-CM | POA: Insufficient documentation

## 2023-03-11 LAB — COMPREHENSIVE METABOLIC PANEL
ALT: 15 U/L (ref 0–44)
AST: 24 U/L (ref 15–41)
Albumin: 3.9 g/dL (ref 3.5–5.0)
Alkaline Phosphatase: 73 U/L (ref 38–126)
Anion gap: 14 (ref 5–15)
BUN: 10 mg/dL (ref 6–20)
CO2: 16 mmol/L — ABNORMAL LOW (ref 22–32)
Calcium: 9.1 mg/dL (ref 8.9–10.3)
Chloride: 106 mmol/L (ref 98–111)
Creatinine, Ser: 0.67 mg/dL (ref 0.44–1.00)
GFR, Estimated: >60 mL/min (ref >60)
Glucose, Bld: 138 mg/dL — ABNORMAL HIGH (ref 70–99)
Potassium: 3.4 mmol/L — ABNORMAL LOW (ref 3.5–5.1)
Sodium: 136 mmol/L (ref 135–145)
Total Bilirubin: 0.2 mg/dL — ABNORMAL LOW (ref 0.3–1.2)
Total Protein: 7.6 g/dL (ref 6.5–8.1)

## 2023-03-11 LAB — CBC WITH DIFFERENTIAL/PLATELET
Abs Immature Granulocytes: 0.08 10*3/uL — ABNORMAL HIGH (ref 0.00–0.07)
Basophils Absolute: 0.1 10*3/uL (ref 0.0–0.1)
Basophils Relative: 1 %
Eosinophils Absolute: 0.4 10*3/uL (ref 0.0–0.5)
Eosinophils Relative: 4 %
HCT: 39.5 % (ref 36.0–46.0)
Hemoglobin: 13.4 g/dL (ref 12.0–15.0)
Immature Granulocytes: 1 %
Lymphocytes Relative: 43 %
Lymphs Abs: 4 10*3/uL (ref 0.7–4.0)
MCH: 29.4 pg (ref 26.0–34.0)
MCHC: 33.9 g/dL (ref 30.0–36.0)
MCV: 86.6 fL (ref 80.0–100.0)
Monocytes Absolute: 0.6 10*3/uL (ref 0.1–1.0)
Monocytes Relative: 6 %
Neutro Abs: 4.3 10*3/uL (ref 1.7–7.7)
Neutrophils Relative %: 45 %
Platelets: 295 10*3/uL (ref 150–400)
RBC: 4.56 MIL/uL (ref 3.87–5.11)
RDW: 13.6 % (ref 11.5–15.5)
WBC: 9.4 10*3/uL (ref 4.0–10.5)
nRBC: 0 % (ref 0.0–0.2)

## 2023-03-11 LAB — PREGNANCY, URINE: Preg Test, Ur: NEGATIVE

## 2023-03-11 LAB — ETHANOL: Alcohol, Ethyl (B): 106 mg/dL — ABNORMAL HIGH (ref <10)

## 2023-03-11 LAB — RAPID URINE DRUG SCREEN, HOSP PERFORMED
Amphetamines: POSITIVE — AB
Barbiturates: NOT DETECTED
Benzodiazepines: NOT DETECTED
Cocaine: NOT DETECTED
Opiates: NOT DETECTED
Tetrahydrocannabinol: NOT DETECTED

## 2023-03-11 LAB — SARS CORONAVIRUS 2 BY RT PCR: SARS Coronavirus 2 by RT PCR: NEGATIVE

## 2023-03-11 LAB — GROUP A STREP BY PCR: Group A Strep by PCR: NOT DETECTED

## 2023-03-11 MED ORDER — OMEPRAZOLE 40 MG PO CPDR
40.0000 mg | DELAYED_RELEASE_CAPSULE | Freq: Every day | ORAL | 1 refills | Status: DC
Start: 2023-03-11 — End: 2023-08-05

## 2023-03-11 MED ORDER — ALUM & MAG HYDROXIDE-SIMETH 200-200-20 MG/5ML PO SUSP
30.0000 mL | Freq: Once | ORAL | Status: AC
  Administered 2023-03-11: 30 mL via ORAL
  Filled 2023-03-11: qty 30

## 2023-03-11 MED ORDER — SUCRALFATE 1 G PO TABS
1.0000 g | ORAL_TABLET | Freq: Three times a day (TID) | ORAL | 0 refills | Status: DC
Start: 2023-03-11 — End: 2023-05-06

## 2023-03-11 NOTE — ED Notes (Signed)
Patient has been wanded by security. Patient is changed in to appropriate scrubs.

## 2023-03-11 NOTE — ED Notes (Addendum)
Called to room by pt. Complaints of cough, congestion and chest tightness. EDP notified

## 2023-03-11 NOTE — Progress Notes (Signed)
Normal CXR. GREAT news.

## 2023-03-11 NOTE — ED Provider Notes (Signed)
Patient cleared by psychiatry.  Discharge.   Virgina Norfolk, DO 03/11/23 1036

## 2023-03-11 NOTE — ED Provider Notes (Signed)
[place] [facility] AT [facility]  Provider Note  CSN: [id number] Arrival date & time: 03/11/23 0159  History Chief Complaint  Patient presents with   Mental Health Problem   Stress    Patricia Potter is a 37 y.o. female with history of depression, ADHD reports she has been overwhelmed with multiple life stressors recently and began having SI earlier tonight. She had feelings that she did not want to be here anymore and had a plan to stab herself with a knife. She reports she has felt like she might be getting a URI recently with some chest discomfort and general malaise but no fever. No other somatic complaints. She called a crisis hotline and was told to come to this ED for evaluation.    Home Medications Prior to Admission medications   Medication Sig Start Date End Date Taking? Authorizing Provider  ALPRAZolam Prudy Feeler) 0.5 MG tablet Take 1 tablet (0.5 mg total) by mouth at bedtime as needed for anxiety. 02/04/23  Yes Breeback, Jade L, PA-C  amphetamine-dextroamphetamine (ADDERALL) 15 MG tablet Take 1 tablet by mouth 2 (two) times daily. 04/05/23  Yes Breeback, Jade L, PA-C  busPIRone (BUSPAR) 7.5 MG tablet Take 1 tablet (7.5 mg total) by mouth 3 (three) times daily. 09/01/22  Yes Breeback, Jade L, PA-C  pregabalin (LYRICA) 75 MG capsule Take 1 capsule (75 mg total) by mouth 2 (two) times daily. 02/04/23  Yes Breeback, Jade L, PA-C  amphetamine-dextroamphetamine (ADDERALL) 15 MG tablet Take 1 tablet by mouth 2 (two) times daily. 02/04/23   Breeback, Jade L, PA-C  amphetamine-dextroamphetamine (ADDERALL) 15 MG tablet Take 1 tablet by mouth 2 (two) times daily. 03/06/23   Jomarie Longs, PA-C  metFORMIN (GLUCOPHAGE) 500 MG tablet Take 1 tablet (500 mg total) by mouth 2 (two) times daily with a meal. 09/01/22   Breeback, Jade L, PA-C     Allergies    Iodine and Wellbutrin [bupropion]   Review of Systems   Review of Systems Please see HPI for pertinent positives  and negatives  Physical Exam BP (!) 119/98 (BP Location: Right Arm)   Pulse (!) 124   Temp 98.4 F (36.9 C) (Oral)   Resp 20   Ht 5\' 2"  (1.575 m)   Wt 86.2 kg   LMP 02/11/2023 (Approximate)   SpO2 98%   BMI 34.75 kg/m   Physical Exam Vitals and nursing note reviewed.  Constitutional:      Appearance: Normal appearance.  HENT:     Head: Normocephalic and atraumatic.     Nose: Nose normal.     Mouth/Throat:     Mouth: Mucous membranes are moist.  Eyes:     Extraocular Movements: Extraocular movements intact.     Conjunctiva/sclera: Conjunctivae normal.  Cardiovascular:     Rate and Rhythm: Normal rate.  Pulmonary:     Effort: Pulmonary effort is normal.     Breath sounds: Normal breath sounds.  Abdominal:     General: Abdomen is flat.     Palpations: Abdomen is soft.     Tenderness: There is no abdominal tenderness.  Musculoskeletal:        General: No swelling. Normal range of motion.     Cervical back: Neck supple.  Skin:    General: Skin is warm and dry.  Neurological:     General: No focal deficit present.     Mental Status: She is alert.  Psychiatric:        Mood  and Affect: Mood is anxious and depressed. Affect is tearful.     ED Results / Procedures / Treatments   EKG EKG Interpretation Date/Time:  Wednesday March 11 2023 02:58:50 EDT Ventricular Rate:  105 PR Interval:  125 QRS Duration:  96 QT Interval:  354 QTC Calculation: 468 R Axis:   42  Text Interpretation: Sinus tachycardia RSR' in V1 or V2, probably normal variant No old tracing to compare Confirmed by Susy Frizzle [id number]) on 03/11/2023 3:01:25 AM  Procedures Procedures  Medications Ordered in the ED Medications - No data to display  Initial Impression and Plan  Patient here for mental health evaluation with SI earlier tonight. She drove herself here on advices of a crisis hotline. She reports some recent chest discomfort and maybe earlier URI symptoms. Will check CBC, CMP,  UDS, EtOH, Covid and EKG. Plan TTS consult when medically clear.   ED Course   Clinical Course as of 03/11/23 0645  Wed Mar 11, 2023  0311 CBC is normal [CS]  0311 Hcg is neg [CS]  0329 UDS positive for amphetamines, consistent with Rx history.  [CS]  0332 EtOH is mildly elevated.  [CS]  0332 CMP is unremarkable.  [CS]  [id number] Patient medically cleared for TTS.  [CS]  0346 Covid is negative.  [CS]    Clinical Course User Index [CS] Pollyann Savoy, MD     MDM Rules/Calculators/A&P Medical Decision Making Problems Addressed: Alcoholic intoxication without complication [facility]): acute illness or injury Depression, unspecified depression type: chronic illness or injury with exacerbation, progression, or side effects of treatment Suicidal ideation: acute illness or injury  Amount and/or Complexity of Data Reviewed Labs: ordered. Decision-making details documented in ED Course. ECG/medicine tests: ordered and independent interpretation performed. Decision-making details documented in ED Course.  Risk Decision regarding hospitalization.     Final Clinical Impression(s) / ED Diagnoses Final diagnoses:  Depression, unspecified depression type  Alcoholic intoxication without complication (HCC)  Suicidal ideation    Rx / DC Orders ED Discharge Orders     None        Pollyann Savoy, MD 03/11/23 [id number]

## 2023-03-11 NOTE — ED Notes (Signed)
Mom is at bedside.
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
## 2023-03-11 NOTE — ED Triage Notes (Signed)
Pt states here for mental health check, state had enough tonight and earlier didn't want to be here. States due to multiple things that have built up.

## 2023-03-11 NOTE — ED Notes (Signed)
Called [facility] AC to inquire who would be doing TTS. AC will call with update after 9am
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
## 2023-03-11 NOTE — ED Notes (Signed)
Patient on computer with TTS.

## 2023-03-11 NOTE — Patient Instructions (Addendum)
Increase omeprazole to 40mg  twice a day Add carafate with meals Referral to GI

## 2023-03-12 ENCOUNTER — Telehealth: Payer: Self-pay | Admitting: *Deleted

## 2023-03-12 DIAGNOSIS — R051 Acute cough: Secondary | ICD-10-CM | POA: Insufficient documentation

## 2023-03-12 DIAGNOSIS — R0789 Other chest pain: Secondary | ICD-10-CM | POA: Insufficient documentation

## 2023-03-12 DIAGNOSIS — R0602 Shortness of breath: Secondary | ICD-10-CM | POA: Insufficient documentation

## 2023-03-12 DIAGNOSIS — R1013 Epigastric pain: Secondary | ICD-10-CM | POA: Insufficient documentation

## 2023-03-12 LAB — LIPASE: Lipase: 30 U/L (ref 14–72)
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
## 2023-03-12 NOTE — Progress Notes (Signed)
Lipase is normal. No signs of pancreatitis.

## 2023-03-12 NOTE — Telephone Encounter (Signed)
I had LVM telling patient that I saw where she had seen her results and if she had any questions to call office back.   She called and stated that she is still having the pain in her chest and throat.  She said that her throat is hurting and it is hard to eat and swallow. She would like to know what next steps will be. I do see where a GI referral was placed. Please advise.

## 2023-03-13 ENCOUNTER — Encounter: Payer: Self-pay | Admitting: Physician Assistant

## 2023-03-13 MED ORDER — HYOSCYAMINE SULFATE 0.125 MG SL SUBL
0.1250 mg | SUBLINGUAL_TABLET | Freq: Once | SUBLINGUAL | Status: AC
Start: 2023-03-13 — End: 2023-03-13
  Administered 2023-03-13: 0.125 mg via ORAL

## 2023-03-13 MED ORDER — LIDOCAINE VISCOUS HCL 2 % MT SOLN
15.0000 mL | Freq: Once | OROMUCOSAL | Status: AC
Start: 2023-03-13 — End: 2023-03-13
  Administered 2023-03-13: 15 mL via OROMUCOSAL

## 2023-03-13 MED ORDER — ALUM & MAG HYDROXIDE-SIMETH 400-400-40 MG/5ML PO SUSP
30.0000 mL | Freq: Once | ORAL | Status: AC
Start: 2023-03-13 — End: 2023-03-13
  Administered 2023-03-13: 30 mL via ORAL

## 2023-03-13 NOTE — Telephone Encounter (Signed)
Referral, clinical notes, demographics and copies of insurance cards have been faxed to [facility] Sharper at [phone]. Office will contact patient to schedule referral appointment.

## 2023-03-13 NOTE — Telephone Encounter (Signed)
Attempted call to patient. Left a voice mail message requesting a return call.  

## 2023-03-13 NOTE — Telephone Encounter (Signed)
Can we make the GI referral that was sent urgent for this patient ?

## 2023-03-13 NOTE — Telephone Encounter (Signed)
Patient states she is taking omeprazole BID - She has not yet picked up the carafate but will start that once picked up at pharmacy She said the GI cocktail caused numbness in her throat but once that wore off the pain returned.

## 2023-03-13 NOTE — Addendum Note (Signed)
Addended by: Carren Rang A on: 03/13/2023 03:16 PM   Modules accepted: Orders
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
## 2023-05-02 DIAGNOSIS — R1010 Upper abdominal pain, unspecified: Secondary | ICD-10-CM | POA: Diagnosis not present

## 2023-05-02 DIAGNOSIS — R110 Nausea: Secondary | ICD-10-CM | POA: Diagnosis not present

## 2023-05-02 DIAGNOSIS — Z20822 Contact with and (suspected) exposure to covid-19: Secondary | ICD-10-CM | POA: Diagnosis not present

## 2023-05-02 DIAGNOSIS — Z87442 Personal history of urinary calculi: Secondary | ICD-10-CM | POA: Diagnosis not present

## 2023-05-02 DIAGNOSIS — Z91041 Radiographic dye allergy status: Secondary | ICD-10-CM | POA: Diagnosis not present

## 2023-05-02 DIAGNOSIS — F17210 Nicotine dependence, cigarettes, uncomplicated: Secondary | ICD-10-CM | POA: Diagnosis not present

## 2023-05-02 DIAGNOSIS — R112 Nausea with vomiting, unspecified: Secondary | ICD-10-CM | POA: Diagnosis not present

## 2023-05-02 DIAGNOSIS — R1084 Generalized abdominal pain: Secondary | ICD-10-CM | POA: Diagnosis not present

## 2023-05-04 ENCOUNTER — Other Ambulatory Visit: Payer: Self-pay | Admitting: Physician Assistant

## 2023-05-04 ENCOUNTER — Telehealth: Payer: Self-pay | Admitting: Physician Assistant

## 2023-05-04 MED ORDER — PROMETHAZINE HCL 25 MG PO TABS
25.0000 mg | ORAL_TABLET | Freq: Four times a day (QID) | ORAL | 0 refills | Status: DC | PRN
Start: 2023-05-04 — End: 2023-05-06

## 2023-05-04 NOTE — Telephone Encounter (Signed)
Patient called she has a stomach bug she is requesting medication she is in a lot pain and can't sleep please advise  [facility] [address] Phone [phone]

## 2023-05-06 ENCOUNTER — Emergency Department (HOSPITAL_BASED_OUTPATIENT_CLINIC_OR_DEPARTMENT_OTHER)
Admission: EM | Admit: 2023-05-06 | Discharge: 2023-05-06 | Disposition: A | Discharge status: Home / Self Care | Payer: Medicaid Other | Attending: Emergency Medicine | Admitting: Emergency Medicine

## 2023-05-06 ENCOUNTER — Emergency Department (HOSPITAL_BASED_OUTPATIENT_CLINIC_OR_DEPARTMENT_OTHER): Payer: Medicaid Other

## 2023-05-06 ENCOUNTER — Encounter (HOSPITAL_BASED_OUTPATIENT_CLINIC_OR_DEPARTMENT_OTHER): Payer: Self-pay

## 2023-05-06 ENCOUNTER — Other Ambulatory Visit: Payer: Self-pay

## 2023-05-06 DIAGNOSIS — K429 Umbilical hernia without obstruction or gangrene: Secondary | ICD-10-CM | POA: Diagnosis not present

## 2023-05-06 DIAGNOSIS — R112 Nausea with vomiting, unspecified: Secondary | ICD-10-CM | POA: Insufficient documentation

## 2023-05-06 DIAGNOSIS — K920 Hematemesis: Secondary | ICD-10-CM | POA: Diagnosis not present

## 2023-05-06 DIAGNOSIS — R1013 Epigastric pain: Secondary | ICD-10-CM | POA: Insufficient documentation

## 2023-05-06 DIAGNOSIS — R109 Unspecified abdominal pain: Secondary | ICD-10-CM

## 2023-05-06 DIAGNOSIS — Z85850 Personal history of malignant neoplasm of thyroid: Secondary | ICD-10-CM | POA: Diagnosis not present

## 2023-05-06 DIAGNOSIS — Z5321 Procedure and treatment not carried out due to patient leaving prior to being seen by health care provider: Secondary | ICD-10-CM | POA: Diagnosis not present

## 2023-05-06 LAB — URINALYSIS, MICROSCOPIC (REFLEX)

## 2023-05-06 LAB — URINALYSIS, ROUTINE W REFLEX MICROSCOPIC
Bilirubin Urine: NEGATIVE
Glucose, UA: NEGATIVE mg/dL
Ketones, ur: NEGATIVE mg/dL
Leukocytes,Ua: NEGATIVE
Nitrite: NEGATIVE
Protein, ur: 100 mg/dL — AB
Specific Gravity, Urine: >=1.03 (ref 1.005–1.030)
pH: 6.5 (ref 5.0–8.0)

## 2023-05-06 LAB — CBC WITH DIFFERENTIAL/PLATELET
Abs Immature Granulocytes: 0.1 10*3/uL — ABNORMAL HIGH (ref 0.00–0.07)
Basophils Absolute: 0.1 10*3/uL (ref 0.0–0.1)
Basophils Relative: 1 %
Eosinophils Absolute: 0.1 10*3/uL (ref 0.0–0.5)
Eosinophils Relative: 0 %
HCT: 39.7 % (ref 36.0–46.0)
Hemoglobin: 13.8 g/dL (ref 12.0–15.0)
Immature Granulocytes: 1 %
Lymphocytes Relative: 22 %
Lymphs Abs: 2.9 10*3/uL (ref 0.7–4.0)
MCH: 29.7 pg (ref 26.0–34.0)
MCHC: 34.8 g/dL (ref 30.0–36.0)
MCV: 85.6 fL (ref 80.0–100.0)
Monocytes Absolute: 0.8 10*3/uL (ref 0.1–1.0)
Monocytes Relative: 6 %
Neutro Abs: 9.4 10*3/uL — ABNORMAL HIGH (ref 1.7–7.7)
Neutrophils Relative %: 70 %
Platelets: 312 10*3/uL (ref 150–400)
RBC: 4.64 MIL/uL (ref 3.87–5.11)
RDW: 12.9 % (ref 11.5–15.5)
WBC: 13.4 10*3/uL — ABNORMAL HIGH (ref 4.0–10.5)
nRBC: 0 % (ref 0.0–0.2)

## 2023-05-06 LAB — COMPREHENSIVE METABOLIC PANEL
ALT: 17 U/L (ref 0–44)
AST: 22 U/L (ref 15–41)
Albumin: 4.3 g/dL (ref 3.5–5.0)
Alkaline Phosphatase: 69 U/L (ref 38–126)
Anion gap: 10 (ref 5–15)
BUN: 11 mg/dL (ref 6–20)
CO2: 21 mmol/L — ABNORMAL LOW (ref 22–32)
Calcium: 9 mg/dL (ref 8.9–10.3)
Chloride: 103 mmol/L (ref 98–111)
Creatinine, Ser: 0.6 mg/dL (ref 0.44–1.00)
GFR, Estimated: >60 mL/min (ref >60)
Glucose, Bld: 123 mg/dL — ABNORMAL HIGH (ref 70–99)
Potassium: 3.8 mmol/L (ref 3.5–5.1)
Sodium: 134 mmol/L — ABNORMAL LOW (ref 135–145)
Total Bilirubin: 0.8 mg/dL (ref <1.2)
Total Protein: 7.4 g/dL (ref 6.5–8.1)

## 2023-05-06 LAB — LIPASE, BLOOD: Lipase: 29 U/L (ref 11–51)

## 2023-05-06 LAB — PREGNANCY, URINE: Preg Test, Ur: NEGATIVE

## 2023-05-06 MED ORDER — SODIUM CHLORIDE 0.9 % IV BOLUS
1000.0000 mL | Freq: Once | INTRAVENOUS | Status: AC
  Administered 2023-05-06: 1000 mL via INTRAVENOUS

## 2023-05-06 MED ORDER — LIDOCAINE VISCOUS HCL 2 % MT SOLN
15.0000 mL | Freq: Once | OROMUCOSAL | Status: AC
  Administered 2023-05-06: 15 mL via ORAL
  Filled 2023-05-06: qty 15

## 2023-05-06 MED ORDER — SUCRALFATE 1 G PO TABS: 1.0000 g | ORAL_TABLET | Freq: Three times a day (TID) | ORAL | 0 refills | Status: DC

## 2023-05-06 MED ORDER — METOCLOPRAMIDE HCL 5 MG/ML IJ SOLN
5.0000 mg | Freq: Once | INTRAMUSCULAR | Status: AC
Start: 2023-05-06 — End: 2023-05-06
  Administered 2023-05-06: 5 mg via INTRAVENOUS
  Filled 2023-05-06: qty 2

## 2023-05-06 MED ORDER — ALUM & MAG HYDROXIDE-SIMETH 200-200-20 MG/5ML PO SUSP
30.0000 mL | Freq: Once | ORAL | Status: AC
  Administered 2023-05-06: 30 mL via ORAL
  Filled 2023-05-06: qty 30

## 2023-05-06 MED ORDER — PROCHLORPERAZINE MALEATE 5 MG PO TABS: 5.0000 mg | ORAL_TABLET | Freq: Two times a day (BID) | ORAL | 0 refills | Status: AC | PRN

## 2023-05-06 MED ORDER — ACETAMINOPHEN 325 MG PO TABS
650.0000 mg | ORAL_TABLET | Freq: Once | ORAL | Status: AC
  Administered 2023-05-06: 650 mg via ORAL
  Filled 2023-05-06: qty 2

## 2023-05-06 MED ORDER — DIPHENHYDRAMINE HCL 50 MG/ML IJ SOLN
25.0000 mg | Freq: Once | INTRAMUSCULAR | Status: AC
  Administered 2023-05-06: 25 mg via INTRAVENOUS
  Filled 2023-05-06: qty 1

## 2023-05-06 NOTE — ED Notes (Signed)
Pt advised she's taken Phenergan at home and Zofran as well without improvement. 5x days of nausea and vomitting. Minor diarrhea but GI upset is bigger complaint. Pt advised a few days after vomitting, she began to throw up straight blood as well as blood-tinged sputum. Pt denied lower GI bleed sx. Nausea persistent. Denies drug or ETOH abuse, only had 1x drink 1x day ago without complaint, it actually made her feel better and attempt to eat.
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
## 2023-05-06 NOTE — ED Triage Notes (Signed)
In for eval of abd pain with nausea, vomiting, and diarrhea. Symptoms started Friday evening. Was seen Saturday morning at [facility] ED. Took phenergan, bentyl, and zofran without relief. Possible vomiting blood.

## 2023-05-06 NOTE — Discharge Instructions (Signed)
  There are many causes of abdominal pain. Most pain is not serious and goes away, but some pain gets worse, changes, or will not go away. Please return to the emergency department or see your doctor right away if you (or your family member) experience any of the following:  1. Pain that gets worse or moves to just one spot.  2. Pain that gets worse if you cough or sneeze.  3. Pain with going over a bump in the road.  4. Pain that does not get better in 24 hours.  5. Inability to keep down liquids (vomiting)-especially if you are making less urine.  6. Fainting.  7. Blood in the vomit or stool.  8. High fever or shaking chills.  9. Swelling of the abdomen.  10. Any new or worsening problem.     Additional Instructions  No alcohol.  No caffeine, aspirin, or cigarettes.   Please return to the emergency department immediately for any new or concerning symptoms, or if you get worse.

## 2023-05-08 ENCOUNTER — Encounter: Payer: Self-pay | Admitting: Physician Assistant

## 2023-05-08 ENCOUNTER — Ambulatory Visit: Payer: Medicaid Other | Admitting: Physician Assistant

## 2023-05-08 VITALS — BP 138/90 | HR 123 | Ht 62.0 in | Wt 190.0 lb

## 2023-05-08 DIAGNOSIS — E6609 Other obesity due to excess calories: Secondary | ICD-10-CM

## 2023-05-08 DIAGNOSIS — A084 Viral intestinal infection, unspecified: Secondary | ICD-10-CM | POA: Diagnosis not present

## 2023-05-08 DIAGNOSIS — F902 Attention-deficit hyperactivity disorder, combined type: Secondary | ICD-10-CM

## 2023-05-08 DIAGNOSIS — K5790 Diverticulosis of intestine, part unspecified, without perforation or abscess without bleeding: Secondary | ICD-10-CM | POA: Insufficient documentation

## 2023-05-08 DIAGNOSIS — Z6834 Body mass index (BMI) 34.0-34.9, adult: Secondary | ICD-10-CM

## 2023-05-08 DIAGNOSIS — E66811 Obesity, class 1: Secondary | ICD-10-CM | POA: Diagnosis not present

## 2023-05-08 DIAGNOSIS — K760 Fatty (change of) liver, not elsewhere classified: Secondary | ICD-10-CM | POA: Insufficient documentation

## 2023-05-08 DIAGNOSIS — R7303 Prediabetes: Secondary | ICD-10-CM

## 2023-05-08 DIAGNOSIS — R7309 Other abnormal glucose: Secondary | ICD-10-CM | POA: Diagnosis not present

## 2023-05-08 LAB — POCT GLYCOSYLATED HEMOGLOBIN (HGB A1C): Hemoglobin A1C: 5.9 % — AB (ref 4.0–5.6)

## 2023-05-08 MED ORDER — TIRZEPATIDE 5 MG/0.5ML ~~LOC~~ SOAJ: 5.0000 mg | SUBCUTANEOUS | 0 refills | Status: DC

## 2023-05-08 MED ORDER — LISDEXAMFETAMINE DIMESYLATE 30 MG PO CAPS: 30.0000 mg | ORAL_CAPSULE | Freq: Every day | ORAL | 0 refills | Status: DC

## 2023-05-08 MED ORDER — MOUNJARO 2.5 MG/0.5ML ~~LOC~~ SOAJ: 2.5000 mg | SUBCUTANEOUS | 0 refills | Status: DC

## 2023-05-08 NOTE — Patient Instructions (Signed)
Stay on omeprazole 40mg  twice daily.  Start vyvanse, stop Adderall Mounjaro to start for fatty liver/obesity/elevated randome glucose

## 2023-05-08 NOTE — Progress Notes (Unsigned)
   Established Patient Office Visit  Subjective   Patient ID: Patricia Potter, female    DOB: [birth year]  Age: 37 y.o. MRN: [id number]  Chief Complaint  Patient presents with   Hospitalization Follow-up    Patient with intermittent abdominal cramping, primarily epigastrium.  Ongoing nausea and vomiting.  She has some streaks of blood in her emesis last night.  No BRBPR or melena.  She drinks alcohol frequently every couple days, 3-5 drinks.     HPI 37 yo female presents today for hospital follow up and ADHD medication refill. Patient presented to [facility] ED on 05/02/23 for abdominal pain and N/V and to [facility] ED on 05/06/23 for progressing symptoms. She noted blood in her emesis. Mildly elevated WBCs noted and elevated glucose. CT showed 2.5 cm abdominal cyst and mild diverticulosis. Lipase WLN. She tried Zofran and Phenergan which did not help. [facility] prescribed compazine which helped. She is now feeling better.   The patient is interested in switching her Adderall ADHD medication to Vyvanse.  Patient also reports difficulty losing weight. She reports frequent exercise. She is interested in medication.   Review of Systems  Gastrointestinal:  Negative for abdominal pain, nausea and vomiting.      Objective:     BP (!) 138/90 (BP Location: Right Arm)   Pulse (!) 123   Ht 5\' 2"  (1.575 m)   Wt 86.2 kg   LMP 04/12/2023 Comment: neg upreg in er 05/06/2023  SpO2 100%   BMI 34.75 kg/m  {Vitals History (Optional):23777}  Physical Exam Abdominal:     General: There is no distension.     Palpations: There is no mass.     Tenderness: There is no abdominal tenderness. There is no right CVA tenderness, left CVA tenderness, guarding or rebound.     Hernia: No hernia is present.    The ASCVD Risk score (Arnett DK, et al., 2019) failed to calculate for the following reasons:   The 2019 ASCVD risk score is only valid for ages 30 to 88    Assessment & Plan:   Problem List Items Addressed  This Visit       Digestive   Fatty liver   Diverticulosis     Other   Attention deficit hyperactivity disorder (ADHD), combined type   Relevant Medications   lisdexamfetamine (VYVANSE) 30 MG capsule   Class 1 obesity due to excess calories without serious comorbidity with body mass index (BMI) of 34.0 to 34.9 in adult   Relevant Medications   tirzepatide (MOUNJARO) 2.5 MG/0.5ML Pen   lisdexamfetamine (VYVANSE) 30 MG capsule   tirzepatide [facility]) 5 MG/0.5ML Pen (Start on 06/07/2023)   Elevated glucose - Primary   Relevant Orders   POCT HgB A1C (Completed)   Other Visit Diagnoses     Viral enteritis          Patient appears stable and no further complaints of abdominal pain or N/V.  D/c Adderall. Started Vyvanse. Advised patient that she can increase Vyvanse dosage if current dose is ineffective.  Mounjaro prescribed for weight loss and elevated blood sugar.  Advised patient to wait to take [facility] for about 1 month due to recent GI problems.  F/u in 3 mo for routine ADHD and weight loss medication follow up.   Return in about 3 months (around 08/08/2023), or if symptoms worsen or fail to improve.    AnnaCollin M Mace, Student-PA

## 2023-05-08 NOTE — Progress Notes (Signed)
Pre-diabetes-will try to push through the [facility] for weight loss. Watch sugars and carbs in diet. Work on regular exercise 150 minutes a week.

## 2023-05-11 ENCOUNTER — Encounter: Payer: Self-pay | Admitting: Physician Assistant

## 2023-05-11 DIAGNOSIS — R7303 Prediabetes: Secondary | ICD-10-CM | POA: Insufficient documentation

## 2023-05-13 ENCOUNTER — Telehealth: Payer: Self-pay | Admitting: Physician Assistant

## 2023-05-13 NOTE — Telephone Encounter (Signed)
Copied from CRM [id number]. Topic: Clinical - Prescription Issue >> May 13, 2023 11:41 AM Hector Shade B wrote: Reason for CRM: Patient called in regards to [facility] the provider had ordered for her and concerned about the PA whether it had been taking care of.

## 2023-05-21 ENCOUNTER — Other Ambulatory Visit: Payer: Self-pay | Admitting: Physician Assistant

## 2023-05-21 ENCOUNTER — Telehealth: Payer: Self-pay

## 2023-05-21 DIAGNOSIS — E6609 Other obesity due to excess calories: Secondary | ICD-10-CM

## 2023-05-21 NOTE — Telephone Encounter (Signed)
Initiated Prior authorization YQI:[id number] 2.5MG /0.5ML auto-injectors Via: Covermymeds Case/Key:[id number] Status: approved as of 05/21/23 Reason:Authorization Expiration Date: 05/19/2024 Notified Pt via: Mychart

## 2023-06-03 ENCOUNTER — Other Ambulatory Visit: Payer: Self-pay | Admitting: Physician Assistant

## 2023-06-03 DIAGNOSIS — F902 Attention-deficit hyperactivity disorder, combined type: Secondary | ICD-10-CM

## 2023-06-03 MED ORDER — LISDEXAMFETAMINE DIMESYLATE 30 MG PO CAPS
30.0000 mg | ORAL_CAPSULE | Freq: Every day | ORAL | 0 refills | Status: DC
Start: 2023-07-03 — End: 2024-01-05

## 2023-06-03 MED ORDER — LISDEXAMFETAMINE DIMESYLATE 30 MG PO CAPS
30.0000 mg | ORAL_CAPSULE | Freq: Every day | ORAL | 0 refills | Status: DC
Start: 2023-06-03 — End: 2023-07-06

## 2023-06-03 NOTE — Telephone Encounter (Signed)
Last fill 05/08/23 last visit 05/08/23 upcoming visit 08/10/23

## 2023-06-04 ENCOUNTER — Other Ambulatory Visit: Payer: Self-pay | Admitting: Physician Assistant

## 2023-06-04 DIAGNOSIS — F411 Generalized anxiety disorder: Secondary | ICD-10-CM

## 2023-06-05 MED ORDER — ALPRAZOLAM 0.5 MG PO TABS: 0.5000 mg | ORAL_TABLET | Freq: Every evening | ORAL | 2 refills | Status: DC | PRN

## 2023-06-05 NOTE — Addendum Note (Signed)
Addended by: Ernest Mallick A on: 06/05/2023 07:28 AM   Modules accepted: Orders

## 2023-07-06 ENCOUNTER — Other Ambulatory Visit: Payer: Self-pay | Admitting: Physician Assistant

## 2023-07-06 DIAGNOSIS — F902 Attention-deficit hyperactivity disorder, combined type: Secondary | ICD-10-CM

## 2023-07-07 MED ORDER — LISDEXAMFETAMINE DIMESYLATE 30 MG PO CAPS
30.0000 mg | ORAL_CAPSULE | Freq: Every day | ORAL | 0 refills | Status: DC
Start: 2023-07-07 — End: 2023-08-05

## 2023-08-05 ENCOUNTER — Ambulatory Visit: Payer: Medicaid Other | Admitting: Physician Assistant

## 2023-08-05 ENCOUNTER — Encounter: Payer: Self-pay | Admitting: Physician Assistant

## 2023-08-05 VITALS — BP 106/68 | HR 83 | Ht 62.0 in | Wt 183.8 lb

## 2023-08-05 DIAGNOSIS — F902 Attention-deficit hyperactivity disorder, combined type: Secondary | ICD-10-CM

## 2023-08-05 DIAGNOSIS — E66811 Obesity, class 1: Secondary | ICD-10-CM | POA: Diagnosis not present

## 2023-08-05 DIAGNOSIS — E6609 Other obesity due to excess calories: Secondary | ICD-10-CM

## 2023-08-05 DIAGNOSIS — F411 Generalized anxiety disorder: Secondary | ICD-10-CM | POA: Diagnosis not present

## 2023-08-05 DIAGNOSIS — Z6833 Body mass index (BMI) 33.0-33.9, adult: Secondary | ICD-10-CM | POA: Diagnosis not present

## 2023-08-05 DIAGNOSIS — T8090XA Unspecified complication following infusion and therapeutic injection, initial encounter: Secondary | ICD-10-CM | POA: Diagnosis not present

## 2023-08-05 DIAGNOSIS — R7303 Prediabetes: Secondary | ICD-10-CM

## 2023-08-05 DIAGNOSIS — M545 Low back pain, unspecified: Secondary | ICD-10-CM | POA: Diagnosis not present

## 2023-08-05 MED ORDER — LISDEXAMFETAMINE DIMESYLATE 30 MG PO CAPS: 30.0000 mg | ORAL_CAPSULE | Freq: Every day | ORAL | 0 refills | Status: DC

## 2023-08-05 MED ORDER — TRIAMCINOLONE ACETONIDE 0.1 % EX CREA: 1.0000 | TOPICAL_CREAM | Freq: Two times a day (BID) | CUTANEOUS | 0 refills | Status: AC

## 2023-08-05 MED ORDER — BUSPIRONE HCL 7.5 MG PO TABS: 7.5000 mg | ORAL_TABLET | Freq: Three times a day (TID) | ORAL | 3 refills | Status: AC

## 2023-08-05 MED ORDER — CYCLOBENZAPRINE HCL 10 MG PO TABS: 10.0000 mg | ORAL_TABLET | Freq: Three times a day (TID) | ORAL | 0 refills | Status: DC | PRN

## 2023-08-05 MED ORDER — TIRZEPATIDE 7.5 MG/0.5ML ~~LOC~~ SOAJ: 7.5000 mg | SUBCUTANEOUS | 0 refills | Status: DC
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
## 2023-08-05 NOTE — Patient Instructions (Addendum)
 Continue vyvanse Triamcinolone over injection site reaction Flexeril for low back pain Encouraged tens unit and icy hot patches Massage could be helpful as well

## 2023-08-07 ENCOUNTER — Encounter: Payer: Self-pay | Admitting: Physician Assistant

## 2023-08-10 ENCOUNTER — Ambulatory Visit: Payer: Medicaid Other | Admitting: Physician Assistant

## 2023-08-24 DIAGNOSIS — R9431 Abnormal electrocardiogram [ECG] [EKG]: Secondary | ICD-10-CM | POA: Diagnosis not present

## 2023-08-24 DIAGNOSIS — R4182 Altered mental status, unspecified: Secondary | ICD-10-CM | POA: Diagnosis not present

## 2023-08-24 DIAGNOSIS — R410 Disorientation, unspecified: Secondary | ICD-10-CM | POA: Diagnosis not present

## 2023-08-24 DIAGNOSIS — F17210 Nicotine dependence, cigarettes, uncomplicated: Secondary | ICD-10-CM | POA: Diagnosis not present

## 2023-08-24 DIAGNOSIS — K0889 Other specified disorders of teeth and supporting structures: Secondary | ICD-10-CM | POA: Diagnosis not present

## 2023-08-24 DIAGNOSIS — Z87891 Personal history of nicotine dependence: Secondary | ICD-10-CM | POA: Diagnosis not present

## 2023-08-24 DIAGNOSIS — R200 Anesthesia of skin: Secondary | ICD-10-CM | POA: Diagnosis not present

## 2023-08-24 DIAGNOSIS — R000 Tachycardia, unspecified: Secondary | ICD-10-CM | POA: Diagnosis not present

## 2023-08-24 DIAGNOSIS — R202 Paresthesia of skin: Secondary | ICD-10-CM | POA: Diagnosis not present

## 2023-08-24 DIAGNOSIS — R251 Tremor, unspecified: Secondary | ICD-10-CM | POA: Diagnosis not present

## 2023-08-31 ENCOUNTER — Telehealth: Payer: Self-pay | Admitting: Physician Assistant

## 2023-08-31 ENCOUNTER — Other Ambulatory Visit: Payer: Self-pay | Admitting: Physician Assistant

## 2023-08-31 DIAGNOSIS — F902 Attention-deficit hyperactivity disorder, combined type: Secondary | ICD-10-CM

## 2023-08-31 NOTE — Telephone Encounter (Signed)
 Copied from CRM [id number]. Topic: Clinical - Prescription Issue >> Aug 31, 2023 10:50 AM Shon Hale wrote: Reason for CRM: Patient contacted pharmacy to get refill for vyvanse. Patient informed by pharmacy that they do not have a prescription for the month of March but do have one for April.   [facility] showing [facility] to [facility] [address], [place] on 08/05/2023 to be filled 09/04/2023. Patient states the pharmacy does not have that prescription. Patient going out of town, flies out tonight and requesting rx be sent in today if possible.   Requesting callback, [phone]

## 2023-08-31 NOTE — Telephone Encounter (Signed)
 [facility] called and spoke to [place] about the refill(s) vyvanse requested. Advised it was sent on 08/05/23 #30/0 refill(s) for 3 refills on 2/19, 3/21, 4/20. He says they have the one on file to fill for 4/20, but is missing the one to refill this month, so it will need to be resent.  Patient is going out of town today and will need this sent in today per the message below.

## 2023-09-01 MED ORDER — LISDEXAMFETAMINE DIMESYLATE 30 MG PO CAPS: 30.0000 mg | ORAL_CAPSULE | Freq: Every day | ORAL | 0 refills | Status: DC

## 2023-09-02 ENCOUNTER — Telehealth: Payer: Self-pay

## 2023-09-02 NOTE — Telephone Encounter (Signed)
 Last OV 08/05/2023  Last filled 09/04/2023

## 2023-09-02 NOTE — Telephone Encounter (Signed)
 Patient informed that Ardine Bjork has authorized the early refill for today.

## 2023-09-02 NOTE — Telephone Encounter (Signed)
 Copied from CRM [id number]. Topic: Clinical - Prescription Issue >> Sep 02, 2023 10:47 AM Geroge Baseman wrote: Reason for CRM: lisdexamfetamine (VYVANSE) 30 MG capsule. Patient filled meds on 2/19, she went to fill today and it states she can't fill until 3/21. She is currently out and wants to know if there is a way to get this today as she needs it. Please call to advise.

## 2023-09-02 NOTE — Telephone Encounter (Signed)
 Spoke with pharmacy and auth early refill.

## 2023-09-02 NOTE — Telephone Encounter (Signed)
 Called pharmacy and auth early refill  of Vyvanse 30mg  for today- message in separate message.

## 2023-11-02 ENCOUNTER — Ambulatory Visit: Payer: Medicaid Other | Admitting: Physician Assistant

## 2023-11-26 ENCOUNTER — Other Ambulatory Visit: Payer: Self-pay | Admitting: Physician Assistant

## 2023-11-26 DIAGNOSIS — E66811 Obesity, class 1: Secondary | ICD-10-CM

## 2023-11-30 ENCOUNTER — Ambulatory Visit: Admitting: Physician Assistant

## 2024-01-05 ENCOUNTER — Ambulatory Visit: Admitting: Physician Assistant

## 2024-01-05 ENCOUNTER — Encounter: Payer: Self-pay | Admitting: Physician Assistant

## 2024-01-05 VITALS — BP 117/84 | HR 90 | Ht 62.0 in | Wt 168.0 lb

## 2024-01-05 DIAGNOSIS — F411 Generalized anxiety disorder: Secondary | ICD-10-CM

## 2024-01-05 DIAGNOSIS — Z6833 Body mass index (BMI) 33.0-33.9, adult: Secondary | ICD-10-CM | POA: Diagnosis not present

## 2024-01-05 DIAGNOSIS — R7303 Prediabetes: Secondary | ICD-10-CM

## 2024-01-05 DIAGNOSIS — E6609 Other obesity due to excess calories: Secondary | ICD-10-CM

## 2024-01-05 DIAGNOSIS — E66811 Obesity, class 1: Secondary | ICD-10-CM | POA: Diagnosis not present

## 2024-01-05 DIAGNOSIS — F902 Attention-deficit hyperactivity disorder, combined type: Secondary | ICD-10-CM

## 2024-01-05 MED ORDER — TIRZEPATIDE 10 MG/0.5ML ~~LOC~~ SOAJ: 10.0000 mg | SUBCUTANEOUS | 0 refills | Status: AC

## 2024-01-05 MED ORDER — LISDEXAMFETAMINE DIMESYLATE 30 MG PO CAPS: 30.0000 mg | ORAL_CAPSULE | Freq: Every day | ORAL | 0 refills | Status: DC

## 2024-01-05 MED ORDER — ALPRAZOLAM 0.5 MG PO TABS
0.5000 mg | ORAL_TABLET | Freq: Every evening | ORAL | 2 refills | Status: DC | PRN
Start: 2024-01-05 — End: 2024-04-05

## 2024-01-06 ENCOUNTER — Encounter: Payer: Self-pay | Admitting: Physician Assistant

## 2024-01-06 NOTE — Progress Notes (Signed)
 Established Patient Office Visit  Subjective   Patient ID: Patricia Potter, female    DOB: 01/16/1986  Age: 38 y.o. MRN: [id number]  Chief Complaint  Patient presents with   Medical Management of Chronic Issues    HPI Pt is a 38 yo obese female with ADHD, GAD, Pre-diabetes who presents to the clinic for medication follow up.   Pt is doing well on vyvanse . No concerns. She is taking daily and doing well with focus. She denies any increase in anxiety. She continues to take xanax  once a day.   She is on mounjaro  for weight management and glucose control. She is tolerating well. She has lost 15lbs since February. She has lost 25lbs since November. She is eating smaller portions and staying active. She is trying to walk a few times a week.   ROS See HPI.    Objective:     BP 117/84   Pulse 90   Ht 5' 2 (1.575 m)   Wt 168 lb (76.2 kg)   SpO2 99%   BMI 30.73 kg/m  BP Readings from Last 3 Encounters:  01/05/24 117/84  08/05/23 106/68  05/08/23 (!) 138/90   Wt Readings from Last 3 Encounters:  01/05/24 168 lb (76.2 kg)  08/05/23 183 lb 12 oz (83.3 kg)  05/08/23 190 lb (86.2 kg)    ..    01/05/2024   11:48 AM 03/11/2023    3:24 PM 02/04/2023   11:35 AM 09/01/2022   11:16 AM 04/18/2022    1:54 PM  Depression screen PHQ 2/9  Decreased Interest 1 1 0 1 1  Down, Depressed, Hopeless 0 2 0 1 1  PHQ - 2 Score 1 3 0 2 2  Altered sleeping 1 2 2 1  0  Tired, decreased energy 0 3 0 1 0  Change in appetite 0 0 2 2 1   Feeling bad or failure about yourself  0 1 0 1 0  Trouble concentrating 0 0 0 0 0  Moving slowly or fidgety/restless 0 0 0 0 0  Suicidal thoughts 0 1 0 0 0  PHQ-9 Score 2 10 4 7 3   Difficult doing work/chores Not difficult at all Somewhat difficult Not difficult at all Somewhat difficult Somewhat difficult  \..    01/05/2024   11:52 AM 03/11/2023    3:25 PM 02/04/2023   11:37 AM 09/01/2022   11:17 AM  GAD 7 : Generalized Anxiety Score  Nervous, Anxious, on Edge  1 1 2 1   Control/stop worrying 1 2 2 1   Worry too much - different things 1 2 2 1   Trouble relaxing 2 1 3 1   Restless 1 0 1 2  Easily annoyed or irritable 1 0 1 0  Afraid - awful might happen 1 0 2 1  Total GAD 7 Score 8 6 13 7   Anxiety Difficulty Not difficult at all Somewhat difficult Somewhat difficult Somewhat difficult      Physical Exam Constitutional:      Appearance: Normal appearance.  HENT:     Head: Normocephalic.  Cardiovascular:     Rate and Rhythm: Normal rate and regular rhythm.  Pulmonary:     Effort: Pulmonary effort is normal.     Breath sounds: Normal breath sounds.  Neurological:     Mental Status: She is alert and oriented to person, place, and time.  Psychiatric:        Mood and Affect: Mood normal.       Assessment &  Plan:  ..Rhyli was seen today for medical management of chronic issues.  Diagnoses and all orders for this visit:  Attention deficit hyperactivity disorder (ADHD), combined type -     lisdexamfetamine (VYVANSE ) 30 MG capsule; Take 1 capsule (30 mg total) by mouth daily. -     lisdexamfetamine (VYVANSE ) 30 MG capsule; Take 1 capsule (30 mg total) by mouth daily. -     lisdexamfetamine (VYVANSE ) 30 MG capsule; Take 1 capsule (30 mg total) by mouth daily.  GAD (generalized anxiety disorder) -     ALPRAZolam  (XANAX ) 0.5 MG tablet; Take 1 tablet (0.5 mg total) by mouth at bedtime as needed for anxiety. TAKE 1 TABLET BY MOUTH AT BEDTIME AS NEEDED FOR ANXIETY.  Prediabetes -     tirzepatide  (MOUNJARO ) 10 MG/0.5ML Pen; Inject 10 mg into the skin once a week.  Class 1 obesity due to excess calories without serious comorbidity with body mass index (BMI) of 33.0 to 33.9 in adult -     tirzepatide  (MOUNJARO ) 10 MG/0.5ML Pen; Inject 10 mg into the skin once a week.   Vyvanse  refilled for 3 months Xanax  once a day Continue with buspar  TID Increased mounjaro  to 10mg  weekly to continue to help with weight management and glucose  control Vitals look GREAT.    Return in about 3 months (around 04/06/2024).    Niquan Charnley, PA-C

## 2024-04-05 ENCOUNTER — Ambulatory Visit: Admitting: Physician Assistant

## 2024-04-05 ENCOUNTER — Encounter: Payer: Self-pay | Admitting: Physician Assistant

## 2024-04-05 VITALS — BP 137/82 | HR 80 | Ht 62.0 in | Wt 159.0 lb

## 2024-04-05 DIAGNOSIS — F411 Generalized anxiety disorder: Secondary | ICD-10-CM | POA: Diagnosis not present

## 2024-04-05 DIAGNOSIS — M5030 Other cervical disc degeneration, unspecified cervical region: Secondary | ICD-10-CM

## 2024-04-05 DIAGNOSIS — F902 Attention-deficit hyperactivity disorder, combined type: Secondary | ICD-10-CM

## 2024-04-05 MED ORDER — LISDEXAMFETAMINE DIMESYLATE 30 MG PO CAPS: 30.0000 mg | ORAL_CAPSULE | Freq: Every day | ORAL | 0 refills | Status: AC

## 2024-04-05 MED ORDER — LISDEXAMFETAMINE DIMESYLATE 30 MG PO CAPS: 30.0000 mg | ORAL_CAPSULE | Freq: Every day | ORAL | 0 refills | Status: DC

## 2024-04-05 MED ORDER — ALPRAZOLAM 0.5 MG PO TABS: 0.5000 mg | ORAL_TABLET | Freq: Every evening | ORAL | 2 refills | Status: AC | PRN

## 2024-04-05 NOTE — Addendum Note (Signed)
 Addended by: ANTONIETTE VERMELL CROME on: 04/05/2024 02:02 PM   Modules accepted: Orders

## 2024-04-05 NOTE — Progress Notes (Signed)
 Established Patient Office Visit  Subjective   Patient ID: Patricia Potter, female    DOB: 1986-02-06  Age: 38 y.o. MRN: [id number]  Chief Complaint  Patient presents with   Medical Management of Chronic Issues    HPI Discussed the use of AI scribe software for clinical note transcription with the patient, who gave verbal consent to proceed.  History of Present Illness BRENAE Potter is a 38 year old female who presents to clinic for medication refills.   ADHD - No concerns: sleeping, eating well, no mood changes - On Vyvanse  daily  Cervical radiculopathy and musculoskeletal pain - Intermittent shoulder and back pain, occasionally radiating down the arm - Associated tightness with lateral neck movement - Intermittent numbness in the fingers - Imaging previously revealed cervical spine compression - Surgery was recommended by orthopedic specialist but not pursued due to work constraints - Symptom management includes ibuprofen , baths, occasional chiropractic visits, massages, and Icy Hot patches (noted to be costly) - No recent falls - Cautious with heavy lifting due to symptoms  Occupational impact - Works as a Investment banker, operational with a demanding schedule - Work schedule limits ability to seek consistent treatment  Anxiety and sleep disturbance - Uses Xanax  at night as needed for anxiety-related sleep disturbances - Takes Vyvanse      ROS See HPI.    Objective:     BP 137/82   Pulse 80   Ht 5' 2 (1.575 m)   Wt 159 lb (72.1 kg)   SpO2 99%   BMI 29.08 kg/m  BP Readings from Last 3 Encounters:  04/05/24 137/82  01/05/24 117/84  08/05/23 106/68   Wt Readings from Last 3 Encounters:  04/05/24 159 lb (72.1 kg)  01/05/24 168 lb (76.2 kg)  08/05/23 183 lb 12 oz (83.3 kg)    ..    04/05/2024    8:17 AM 01/05/2024   11:48 AM 03/11/2023    3:24 PM 02/04/2023   11:35 AM 09/01/2022   11:16 AM  Depression screen PHQ 2/9  Decreased Interest 1 1 1  0 1  Down, Depressed,  Hopeless 1 0 2 0 1  PHQ - 2 Score 2 1 3  0 2  Altered sleeping 0 1 2 2 1   Tired, decreased energy 0 0 3 0 1  Change in appetite 0 0 0 2 2  Feeling bad or failure about yourself  0 0 1 0 1  Trouble concentrating 0 0 0 0 0  Moving slowly or fidgety/restless 0 0 0 0 0  Suicidal thoughts 0 0 1 0 0  PHQ-9 Score 2 2 10 4 7   Difficult doing work/chores Not difficult at all Not difficult at all Somewhat difficult Not difficult at all Somewhat difficult   ..    04/05/2024    8:17 AM 01/05/2024   11:52 AM 03/11/2023    3:25 PM 02/04/2023   11:37 AM  GAD 7 : Generalized Anxiety Score  Nervous, Anxious, on Edge 1 1 1 2   Control/stop worrying 0 1 2 2   Worry too much - different things 1 1 2 2   Trouble relaxing 1 2 1 3   Restless 0 1 0 1  Easily annoyed or irritable 0 1 0 1  Afraid - awful might happen 0 1 0 2  Total GAD 7 Score 3 8 6 13   Anxiety Difficulty Not difficult at all Not difficult at all Somewhat difficult Somewhat difficult     Physical Exam Constitutional:  Appearance: Normal appearance.  HENT:     Head: Normocephalic.  Cardiovascular:     Rate and Rhythm: Normal rate and regular rhythm.  Pulmonary:     Effort: Pulmonary effort is normal.     Breath sounds: Normal breath sounds.  Neurological:     General: No focal deficit present.     Mental Status: She is alert and oriented to person, place, and time.  Psychiatric:        Mood and Affect: Mood normal.      Assessment & Plan:  SABRASABRATatem was seen today for medical management of chronic issues.  Diagnoses and all orders for this visit:  Attention deficit hyperactivity disorder (ADHD), combined type -     lisdexamfetamine (VYVANSE ) 30 MG capsule; Take 1 capsule (30 mg total) by mouth daily. -     lisdexamfetamine (VYVANSE ) 30 MG capsule; Take 1 capsule (30 mg total) by mouth daily. -     lisdexamfetamine (VYVANSE ) 30 MG capsule; Take 1 capsule (30 mg total) by mouth daily.  GAD (generalized anxiety disorder) -      ALPRAZolam  (XANAX ) 0.5 MG tablet; Take 1 tablet (0.5 mg total) by mouth at bedtime as needed for anxiety. TAKE 1 TABLET BY MOUTH AT BEDTIME AS NEEDED FOR ANXIETY.  DDD (degenerative disc disease), cervical   Assessment & Plan ADHD - no concerns - refilled vyanse  Cervical radiculopathy with intermittent upper extremity numbness Intermittent upper extremity numbness attributed to cervical radiculopathy, confirmed by cervical spine compression on scans. Surgery advised but deferred due to work constraints. - Recommend Icy Hot patches for shoulder and upper back. - Advise exercises to open upper back. - Continue ibuprofen  as needed for inflammation. - Advise avoiding heavy lifting. - Discuss importance of planning for potential future surgery if symptoms persist via consult of orthopedics  Generalized anxiety disorder Anxiety symptoms primarily nocturnal, affecting sleep. Xanax  used as needed for management. - Continue Xanax  0.5 mg at bedtime as needed for anxiety.    Patricia Critzer, PA-C

## 2024-07-06 ENCOUNTER — Ambulatory Visit: Admitting: Physician Assistant
